# Patient Record
Sex: Male | Born: 1970 | Race: White | Hispanic: No | Marital: Married | Smoking: Never smoker
Health system: Southern US, Community
[De-identification: ages and names within clinical notes are randomized; demographics above are authoritative.]

## PROBLEM LIST (undated history)

## (undated) DIAGNOSIS — M5126 Other intervertebral disc displacement, lumbar region: Secondary | ICD-10-CM

## (undated) DIAGNOSIS — I499 Cardiac arrhythmia, unspecified: Secondary | ICD-10-CM

## (undated) DIAGNOSIS — R55 Syncope and collapse: Secondary | ICD-10-CM

## (undated) DIAGNOSIS — G4730 Sleep apnea, unspecified: Secondary | ICD-10-CM

## (undated) DIAGNOSIS — T7840XA Allergy, unspecified, initial encounter: Secondary | ICD-10-CM

## (undated) HISTORY — DX: Allergy, unspecified, initial encounter: T78.40XA

## (undated) HISTORY — DX: Sleep apnea, unspecified: G47.30

## (undated) HISTORY — DX: Other intervertebral disc displacement, lumbar region: M51.26

## (undated) HISTORY — DX: Syncope and collapse: R55

## (undated) HISTORY — DX: Cardiac arrhythmia, unspecified: I49.9

---

## 2006-12-13 ENCOUNTER — Ambulatory Visit: Payer: Self-pay | Admitting: Internal Medicine

## 2006-12-13 DIAGNOSIS — G43909 Migraine, unspecified, not intractable, without status migrainosus: Secondary | ICD-10-CM

## 2006-12-14 ENCOUNTER — Encounter (INDEPENDENT_AMBULATORY_CARE_PROVIDER_SITE_OTHER): Payer: Self-pay | Admitting: Family Medicine

## 2006-12-15 LAB — CONVERTED CEMR LAB
ALT: 18 units/L (ref 0–53)
Basophils Absolute: 0 10*3/uL (ref 0.0–0.1)
Chloride: 104 meq/L (ref 96–112)
Cholesterol: 179 mg/dL (ref 0–200)
Creatinine, Ser: 1.2 mg/dL (ref 0.4–1.5)
Eosinophils Relative: 1.2 % (ref 0.0–5.0)
Glucose, Bld: 94 mg/dL (ref 70–99)
HCT: 45.2 % (ref 39.0–52.0)
Hemoglobin: 15.4 g/dL (ref 13.0–17.0)
LDL Cholesterol: 129 mg/dL — ABNORMAL HIGH (ref 0–99)
MCHC: 34.1 g/dL (ref 30.0–36.0)
MCV: 89.6 fL (ref 78.0–100.0)
Monocytes Absolute: 0.4 10*3/uL (ref 0.2–0.7)
Neutrophils Relative %: 60.4 % (ref 43.0–77.0)
Potassium: 3.9 meq/L (ref 3.5–5.1)
RBC: 5.05 M/uL (ref 4.22–5.81)
RDW: 12.2 % (ref 11.5–14.6)
Sodium: 141 meq/L (ref 135–145)
WBC: 4.1 10*3/uL — ABNORMAL LOW (ref 4.5–10.5)

## 2007-11-01 ENCOUNTER — Ambulatory Visit: Payer: Self-pay | Admitting: Internal Medicine

## 2007-11-01 ENCOUNTER — Encounter (INDEPENDENT_AMBULATORY_CARE_PROVIDER_SITE_OTHER): Payer: Self-pay | Admitting: *Deleted

## 2008-10-30 ENCOUNTER — Telehealth: Payer: Self-pay | Admitting: Internal Medicine

## 2008-12-12 ENCOUNTER — Encounter: Payer: Self-pay | Admitting: Internal Medicine

## 2009-02-19 ENCOUNTER — Ambulatory Visit: Payer: Self-pay | Admitting: Internal Medicine

## 2010-02-19 ENCOUNTER — Ambulatory Visit
Admission: RE | Admit: 2010-02-19 | Discharge: 2010-02-19 | Payer: Self-pay | Source: Home / Self Care | Attending: Internal Medicine | Admitting: Internal Medicine

## 2010-02-19 LAB — CONVERTED CEMR LAB: Heterophile Ab Screen: NEGATIVE

## 2010-03-11 NOTE — Assessment & Plan Note (Signed)
Summary: COLD, COUGH, VERY TIRED--NO FEVER///SPH   Vital Signs:  Patient profile:   40 year old male Height:      74.75 inches Weight:      226 pounds BMI:     28.54 Temp:     98.6 degrees F oral Pulse rate:   77 / minute Pulse rhythm:   regular BP sitting:   122 / 84  (left arm) Cuff size:   large  Vitals Entered By: Army Fossa CMA (February 19, 2010 8:45 AM) CC: Pt here c/o URI?? Comments - fatigued - coughing very little - some nasal congestion Started last saturday [facility] [address]    History of Present Illness: symptoms started 6 days ago Chief complaint is fatigue. Some runny nose and slight cough Wife is also sick, she has more classic URI type of symptoms Today he feels the best in the last few days      Current Medications (verified): 1)  None  Allergies (verified): No Known Drug Allergies  Past History:  Past Medical History: Reviewed history from 12/13/2006 and no changes required. ruptured disc (Lumbar), NO SURGERY  Social History: Reviewed history from 12/13/2006 and no changes required. Married 2 kids  Review of Systems General:  subjective chills . CV:  Denies chest pain or discomfort. Resp:  mild cough, dry  slightly  tight chest w/  cough. GI:  Denies diarrhea and vomiting; some nausea . MS:  some lower back pain, mild . Derm:  Denies rash. Neuro:  HA yesterday, gone today .  Physical Exam  General:  alert, well-developed, and well-nourished.  healthy appearing Head:  face symmetric, nontender to palpation Ears:  R ear normal and L ear normal.   Nose:  no congestive Mouth:  no redness or discharge Lungs:  normal respiratory effort, no intercostal retractions, no accessory muscle use, and normal breath sounds.   Heart:  normal rate, regular rhythm, and no murmur.   Abdomen:  soft, non-tender, normal bowel sounds, no distention, no masses, and no guarding.   Extremities:  no edema    Impression &  Recommendations:  Problem # 1:  VIRAL INFECTION-UNSPEC (ICD-079.99) symptoms consistent with a viral infection.  Due to fatigue, we checked a mono ----> (-)  see instructions   Patient Instructions: 1)  rest, fluids, Tylenol as needed 2)  Robitussin-DM as needed for cough 3)  Call if not  completely well next week   Orders Added: 1)  Est. Patient Level III [16109]    Laboratory Results   Blood Tests      Mono: negative Comments: Army Fossa CMA  February 19, 2010 9:02 AM

## 2010-03-18 ENCOUNTER — Other Ambulatory Visit: Payer: Self-pay | Admitting: Internal Medicine

## 2010-03-18 ENCOUNTER — Encounter: Payer: Self-pay | Admitting: Internal Medicine

## 2010-03-18 ENCOUNTER — Encounter (INDEPENDENT_AMBULATORY_CARE_PROVIDER_SITE_OTHER): Payer: PRIVATE HEALTH INSURANCE | Admitting: Internal Medicine

## 2010-03-18 DIAGNOSIS — E785 Hyperlipidemia, unspecified: Secondary | ICD-10-CM

## 2010-03-18 DIAGNOSIS — Z0000 Encounter for general adult medical examination without abnormal findings: Secondary | ICD-10-CM

## 2010-03-18 LAB — CBC WITH DIFFERENTIAL/PLATELET
Eosinophils Relative: 1.7 % (ref 0.0–5.0)
HCT: 44.8 % (ref 39.0–52.0)
Hemoglobin: 15.8 g/dL (ref 13.0–17.0)
Lymphocytes Relative: 25.3 % (ref 12.0–46.0)
MCHC: 35.2 g/dL (ref 30.0–36.0)
MCV: 87.6 fl (ref 78.0–100.0)
Monocytes Absolute: 0.3 10*3/uL (ref 0.1–1.0)
RBC: 5.11 Mil/uL (ref 4.22–5.81)
RDW: 12.9 % (ref 11.5–14.6)

## 2010-03-18 LAB — PSA: PSA: 1.46 ng/mL (ref 0.10–4.00)

## 2010-03-18 LAB — HEPATIC FUNCTION PANEL
ALT: 24 U/L (ref 0–53)
AST: 21 U/L (ref 0–37)
Bilirubin, Direct: 0.1 mg/dL (ref 0.0–0.3)
Total Bilirubin: 0.7 mg/dL (ref 0.3–1.2)

## 2010-03-18 LAB — LIPID PANEL
Total CHOL/HDL Ratio: 5
Triglycerides: 109 mg/dL (ref 0.0–149.0)
VLDL: 21.8 mg/dL (ref 0.0–40.0)

## 2010-03-18 LAB — BASIC METABOLIC PANEL
CO2: 28 mEq/L (ref 19–32)
Chloride: 100 mEq/L (ref 96–112)
Glucose, Bld: 88 mg/dL (ref 70–99)
Potassium: 4.2 mEq/L (ref 3.5–5.1)
Sodium: 136 mEq/L (ref 135–145)

## 2010-03-25 NOTE — Assessment & Plan Note (Signed)
Summary: CPX-PT WILL BE FASTING/[facility]   Vital Signs:  Patient profile:   40 year old male Height:      75 inches Weight:      227.50 pounds Pulse rate:   71 / minute Pulse rhythm:   regular BP sitting:   130 / 84  (left arm) Cuff size:   large  Vitals Entered By: Army Fossa CMA (March 18, 2010 9:41 AM) CC: CPX, fasting  Comments no complaints  [facility]    History of Present Illness: CPX  Current Medications (verified): 1)  None  Allergies (verified): No Known Drug Allergies  Past History:  Past Medical History: ruptured disc (Lumbar), NO SURGERY MIGRAINES   Past Surgical History: no surgeries   Family History: DM - M HTN - M CHF - F MI--Father 68 y/o ? Colon ca--no prostate ca-- B dx 49      Social History: Married 2 kids [place]aco-- never ETOH-- socially  exercise-- no routine but active   Review of Systems General:  Denies fatigue, fever, and weight loss. CV:  Denies chest pain or discomfort and swelling of feet. Resp:  had a respiratory illness few weeks ago, since then has noted that his "respiratory capacity  is not the same" when he sings in church. Denies cough, wheezing, sputum production, dyspnea on exertion.Marland Kitchen GI:  Denies bloody stools, nausea, and vomiting. GU:  Denies dysuria, hematuria, urinary frequency, and urinary hesitancy. Neuro:  history of migraines, compared  to previous years his migraines have been less frequent than before. They usually respond to Excedrin over-the-counter, very rarely have a severe migraine with nausea. Usual trigger is stress.  Physical Exam  General:  alert, well-developed, and well-nourished.   Neck:  no masses and no thyromegaly.   Lungs:  normal respiratory effort, no intercostal retractions, no accessory muscle use, and normal breath sounds.   Heart:  normal rate, regular rhythm, and no murmur.   Abdomen:  soft, non-tender, normal bowel sounds, no distention, no masses, no guarding,  and no rigidity.   Rectal:  No external abnormalities noted. Normal sphincter tone. No rectal masses or tenderness. Prostate:  Prostate gland firm and smooth, no enlargement, nodularity, tenderness, mass, asymmetry or induration. Extremities:  no edema Psych:  Cognition and judgment appear intact. Alert and cooperative with normal attention span and concentration.  not anxious appearing and not depressed appearing.     Impression & Recommendations:  Problem # 1:  HEALTH SCREENING (ICD-V70.0) Td 08 had a flu shot   never had a Cscope  His brother was diagnosed with prostate cancer recently, DRE  normal, check a PSA Labs diet and  exercise discussed ekg normal, @ baseline  see ROS "respiratory capacity not the same". Physical exam normal, patient will call me if this persists more than 3 weeks. (CXR PFTs ?)  Orders: Venipuncture (16109) TLB-BMP (Basic Metabolic Panel-BMET) (80048-METABOL) TLB-CBC Platelet - w/Differential (85025-CBCD) TLB-Hepatic/Liver Function Pnl (80076-HEPATIC) TLB-Lipid Panel (80061-LIPID) TLB-PSA (Prostate Specific Antigen) (84153-PSA) TLB-TSH (Thyroid Stimulating Hormone) (84443-TSH) Specimen Handling (60454) EKG w/ Interpretation (93000)  Problem # 2:  MIGRAINE HEADACHE (ICD-346.90) well  controlled, see review of systems  Rarely he has severe migraines. Patient knows to call if he has unusual headaches or the headaches are more frequent  Patient Instructions: 1)  Please schedule a follow-up appointment in 1 year.    Orders Added: 1)  Venipuncture [36415] 2)  TLB-BMP (Basic Metabolic Panel-BMET) [80048-METABOL] 3)  TLB-CBC Platelet - w/Differential [85025-CBCD] 4)  TLB-Hepatic/Liver  Function Pnl [80076-HEPATIC] 5)  TLB-Lipid Panel [80061-LIPID] 6)  TLB-PSA (Prostate Specific Antigen) [84153-PSA] 7)  TLB-TSH (Thyroid Stimulating Hormone) [84443-TSH] 8)  Specimen Handling [99000] 9)  EKG w/ Interpretation [93000] 10)  Est. Patient age 22-64  [phone]

## 2010-03-31 NOTE — Letter (Signed)
Summary: [employer] Personalized Risk Profile  [employer] Personalized Risk Profile   Imported By: Maryln Gottron 03/23/2010 10:21:21  _____________________________________________________________________  External Attachment:    Type:   Image     Comment:   External Document

## 2010-04-08 ENCOUNTER — Encounter: Payer: Self-pay | Admitting: Internal Medicine

## 2010-04-08 ENCOUNTER — Ambulatory Visit (INDEPENDENT_AMBULATORY_CARE_PROVIDER_SITE_OTHER): Payer: PRIVATE HEALTH INSURANCE | Admitting: Internal Medicine

## 2010-04-08 DIAGNOSIS — J029 Acute pharyngitis, unspecified: Secondary | ICD-10-CM

## 2010-04-15 NOTE — Assessment & Plan Note (Signed)
Summary: congested/cbs   Vital Signs:  Patient profile:   40 year old male Weight:      225 pounds Temp:     98.2 degrees F oral Pulse rate:   79 / minute Pulse rhythm:   regular BP sitting:   120 / 82  (left arm) Cuff size:   large  Vitals Entered By: Army Fossa CMA (April 08, 2010 10:28 AM) CC: Pt here c/o head congestion, sore throat and HA Comments x 3 days  [facility]    History of Present Illness: x 3 days: ST , moderate  can't sleep due to pain Mild cough   no postnasal dripping   Current Medications (verified): 1)  None  Allergies (verified): No Known Drug Allergies  Past History:  Past Medical History: Reviewed history from 03/18/2010 and no changes required. ruptured disc (Lumbar), NO SURGERY MIGRAINES   Past Surgical History: Reviewed history from 03/18/2010 and no changes required. no surgeries   Review of Systems General:  Denies fever. ENT:  + fontal HA, no d/c or PN drip. Resp:  Denies sputum productive and wheezing.  Physical Exam  General:  alert, well-developed, and well-nourished.   Ears:  R ear normal and L ear normal.   Mouth:   no redness or discharge, tonsils not seen Lungs:  normal respiratory effort, no intercostal retractions, no accessory muscle use, and normal breath sounds.   Heart:  normal rate, regular rhythm, and no murmur.     Impression & Recommendations:  Problem # 1:  PHARYNGITIS-ACUTE (ICD-462)  likely  viral pharyngitis, see instructions  reports intolerance to hydrocodone, apparently has never tried codeine. Will prescribe  codeine, will call if he has any problems.  Orders: Rapid Strep (04540)  Complete Medication List: 1)  Guiatuss Ac 100-10 Mg/25ml Syrp (Guaifenesin-codeine) .Marland Kitchen.. 1 or 2 the patient  by mouth at bedtime  as needed pain , cough  Patient Instructions: 1)  rest, fluids, tylenol 2)  mucinex DM as needed (day time) 3)  codeine  (bedtime) 4)  gargles 5)  call if no better in  few days  Prescriptions: GUIATUSS AC 100-10 MG/5ML SYRP (GUAIFENESIN-CODEINE) 1 or 2 the patient  by mouth at bedtime  as needed pain , cough  #150cc x 0   Entered and Authorized by:   Elita Quick E. Kayson Tasker MD   Signed by:   Nolon Rod. Caliegh Middlekauff MD on 04/08/2010   Method used:   Print then Give to Patient   RxID:   [id number]    Orders Added: 1)  Rapid Strep [87880] 2)  Est. Patient Level III [99213]    Laboratory Results    Other Tests  Rapid Strep: negative Comments: Army Fossa CMA  April 08, 2010 10:41 AM

## 2010-10-28 ENCOUNTER — Telehealth: Payer: Self-pay | Admitting: Internal Medicine

## 2010-10-28 NOTE — Telephone Encounter (Signed)
Advise patient: Due for a FLP, DX hyperlipidemia  (see Centricity) Please arrange

## 2010-11-11 NOTE — Telephone Encounter (Signed)
Lab appt scheduled [id number] @ 9:00

## 2010-11-15 ENCOUNTER — Other Ambulatory Visit: Payer: Self-pay | Admitting: Internal Medicine

## 2010-11-16 ENCOUNTER — Other Ambulatory Visit (INDEPENDENT_AMBULATORY_CARE_PROVIDER_SITE_OTHER): Payer: PRIVATE HEALTH INSURANCE

## 2010-11-16 DIAGNOSIS — E785 Hyperlipidemia, unspecified: Secondary | ICD-10-CM

## 2010-11-16 LAB — LIPID PANEL
LDL Cholesterol: 135 mg/dL — ABNORMAL HIGH (ref 0–99)
Total CHOL/HDL Ratio: 5
Triglycerides: 93 mg/dL (ref 0.0–149.0)

## 2010-11-16 NOTE — Progress Notes (Signed)
Labs only

## 2011-05-30 ENCOUNTER — Ambulatory Visit (INDEPENDENT_AMBULATORY_CARE_PROVIDER_SITE_OTHER): Payer: PRIVATE HEALTH INSURANCE | Admitting: Internal Medicine

## 2011-05-30 ENCOUNTER — Encounter: Payer: Self-pay | Admitting: Internal Medicine

## 2011-05-30 VITALS — BP 116/70 | HR 72 | Temp 98.6°F | Ht 75.0 in | Wt 228.0 lb

## 2011-05-30 DIAGNOSIS — J329 Chronic sinusitis, unspecified: Secondary | ICD-10-CM

## 2011-05-30 MED ORDER — FLUTICASONE PROPIONATE 50 MCG/ACT NA SUSP: 2.0000 | Freq: Every day | NASAL | Status: DC

## 2011-05-30 MED ORDER — AMOXICILLIN 500 MG PO CAPS: 1000.0000 mg | ORAL_CAPSULE | Freq: Two times a day (BID) | ORAL | Status: AC

## 2011-05-30 NOTE — Patient Instructions (Signed)
Rest, fluids , tylenol For cough, take Mucinex DM twice a day as needed  For congestion use flonase, a nasal spray daily  until you feel better Take the antibiotic as prescribed  (Amoxicillin) Call if no better in few days Call anytime if the symptoms are severe -------- You are due for a physical, schedule a visit at your convenience

## 2011-05-30 NOTE — Progress Notes (Signed)
  Subjective:    Patient ID: Joe Castro, male    DOB: 01-Nov-1970, 41 y.o.   MRN: [id number]  HPI Acute visit 2 weeks ago, developed a  "sinus headache", he took some antihistaminics and felt slightly better but has a lingering sinus pressure and congestion on and off. Taking some Mucinex as needed.  Past Medical History: ruptured disc (Lumbar), NO SURGERY MIGRAINES   Past Surgical History: no surgeries   Family History: DM - M HTN - M CHF - F MI--Father 44 y/o ? Colon ca--no prostate ca-- B dx 53      Social History: Married, 2 kids [place]-- never ETOH-- socially  exercise-- no routine but active   Review of Systems No fever or chills No itchy eyes or itchy nose. Very mild cough mostly dry.     Objective:   Physical Exam General -- alert, well-developed. No apparent distress.  Neck --no LADs HEENT -- TMs normal, throat w/o redness, face symmetric and not tender to palpation, nose not congested . Lungs -- normal respiratory effort, no intercostal retractions, no accessory muscle use, and normal breath sounds.   Heart-- normal rate, regular rhythm, no murmur, and no gallop.   Extremities-- no pretibial edema bilaterally  psych-- Cognition and judgment appear intact. Alert and cooperative with normal attention span and concentration.  not anxious appearing and not depressed appearing.       Assessment & Plan:   Upper respiratory symptoms for 2 weeks on and off, review of systems is negative for sneezing, itchy eyes,  itchy nose. Mild sinusitis?. See  instructions

## 2011-05-31 ENCOUNTER — Encounter: Payer: Self-pay | Admitting: Internal Medicine

## 2012-07-03 ENCOUNTER — Ambulatory Visit (INDEPENDENT_AMBULATORY_CARE_PROVIDER_SITE_OTHER): Payer: PRIVATE HEALTH INSURANCE | Admitting: Internal Medicine

## 2012-07-03 VITALS — BP 116/82 | HR 69 | Temp 98.5°F | Wt 230.0 lb

## 2012-07-03 DIAGNOSIS — L259 Unspecified contact dermatitis, unspecified cause: Secondary | ICD-10-CM

## 2012-07-03 DIAGNOSIS — H9319 Tinnitus, unspecified ear: Secondary | ICD-10-CM

## 2012-07-03 DIAGNOSIS — H9313 Tinnitus, bilateral: Secondary | ICD-10-CM

## 2012-07-03 MED ORDER — PREDNISONE 10 MG PO TABS: ORAL_TABLET | ORAL | Status: DC

## 2012-07-03 MED ORDER — HYDROCORTISONE 2.5 % EX CREA: TOPICAL_CREAM | Freq: Two times a day (BID) | CUTANEOUS | Status: DC

## 2012-07-03 NOTE — Progress Notes (Signed)
  Subjective:    Patient ID: Joe Castro, male    DOB: 1970/06/22, 42 y.o.   MRN: [id number]  HPI aacute visit Worked at the yard 4 days ago, the next day developed very itchy rash, he thinks consistent with poison ivy which he had before. Also, one-month history of very mild, bilateral, symmetric tinnitus mostly at night  (" when is quiet").  Past Medical History: ruptured disc (Lumbar), NO SURGERY MIGRAINES   Past Surgical History: no surgeries    Family History: DM - M HTN - M CHF - F MI--Father 33 y/o ? Colon ca--no prostate ca-- B dx 41   Social History: Married, 2 kids [place]-- never ETOH-- socially     Review of Systems Denies fever, chills, joint aches. No nausea vomiting or diarrhea. Denies dizziness, headaches or decreased hearing    Objective:   Physical Exam  Skin:       General -- alert, well-developed, NAD HEENT -- TMs normal  Extremities-- no pretibial edema bilaterally  Neurologic-- alert & oriented X3 and strength normal in all extremities. Psych-- Cognition and judgment appear intact. Alert and cooperative with normal attention span and concentration.  not anxious appearing and not depressed appearing.       Assessment & Plan:  Contact dermatitis, likely poison ivy. Counseled about prevention and recommend wash all his clothing. See instructions  tinnitus Very mild, no decreased hearing. Recommend observation, consider ENT referral at some point.

## 2012-07-03 NOTE — Patient Instructions (Addendum)
Take prednisone as prescribed OTC Zyrtec 10 mg one daily until better. Ranitidine  75 mg one tablet twice a day until better Use the cream as needed Call if not better or if symptoms resurface

## 2012-07-04 ENCOUNTER — Encounter: Payer: Self-pay | Admitting: Internal Medicine

## 2012-07-15 ENCOUNTER — Other Ambulatory Visit: Payer: Self-pay | Admitting: Internal Medicine

## 2012-08-21 ENCOUNTER — Ambulatory Visit (INDEPENDENT_AMBULATORY_CARE_PROVIDER_SITE_OTHER): Payer: BC Managed Care – PPO | Admitting: Internal Medicine

## 2012-08-21 ENCOUNTER — Encounter: Payer: Self-pay | Admitting: Internal Medicine

## 2012-08-21 VITALS — BP 120/75 | HR 72 | Temp 98.5°F | Ht 74.5 in | Wt 226.8 lb

## 2012-08-21 DIAGNOSIS — H9313 Tinnitus, bilateral: Secondary | ICD-10-CM

## 2012-08-21 DIAGNOSIS — Z0000 Encounter for general adult medical examination without abnormal findings: Secondary | ICD-10-CM

## 2012-08-21 DIAGNOSIS — H9319 Tinnitus, unspecified ear: Secondary | ICD-10-CM | POA: Insufficient documentation

## 2012-08-21 LAB — COMPREHENSIVE METABOLIC PANEL
AST: 23 U/L (ref 0–37)
Albumin: 4.3 g/dL (ref 3.5–5.2)
BUN: 15 mg/dL (ref 6–23)
CO2: 31 mEq/L (ref 19–32)
Calcium: 9.8 mg/dL (ref 8.4–10.5)
Chloride: 105 mEq/L (ref 96–112)
Creatinine, Ser: 1.2 mg/dL (ref 0.4–1.5)
GFR: 67.82 mL/min (ref >60.00)
Potassium: 4.3 mEq/L (ref 3.5–5.1)

## 2012-08-21 LAB — CBC WITH DIFFERENTIAL/PLATELET
Basophils Absolute: 0 10*3/uL (ref 0.0–0.1)
Basophils Relative: 1 % (ref 0.0–3.0)
Eosinophils Absolute: 0.1 10*3/uL (ref 0.0–0.7)
Lymphocytes Relative: 29.8 % (ref 12.0–46.0)
MCHC: 34 g/dL (ref 30.0–36.0)
Monocytes Relative: 8.3 % (ref 3.0–12.0)
Neutrophils Relative %: 59.2 % (ref 43.0–77.0)
RBC: 4.86 Mil/uL (ref 4.22–5.81)
RDW: 13.1 % (ref 11.5–14.6)

## 2012-08-21 LAB — LIPID PANEL
HDL: 38.5 mg/dL — ABNORMAL LOW (ref >39.00)
Triglycerides: 109 mg/dL (ref 0.0–149.0)
VLDL: 21.8 mg/dL (ref 0.0–40.0)

## 2012-08-21 NOTE — Assessment & Plan Note (Addendum)
Td ~ 2009 per pt Never had a cscope Diet-exer cise: doing well , encouraged to cont w/ a healthy life style + FH prostae ca, DRE normal today, check a PSA  Episodes of feeling weak as described, given the circumstances, I think he had  an issue of  lack of fluids and exhaustion. EKG today no acute changes, at baseline Recommend to call if he has further episodes, good hydration as well.

## 2012-08-21 NOTE — Progress Notes (Signed)
  Subjective:    Patient ID: Joe Castro, male    DOB: 1970/12/01, 42 y.o.   MRN: [id number]  HPI CPX  Past Medical History  Diagnosis Date  . Ruptured lumbar disc     No surgery  . Migraine    Past Surgical History  Procedure Laterality Date  . No past surgeries     History   Social History  . Marital Status: Married    Spouse Name: N/A    Number of Children: 2  . Years of Education: N/A   Occupational History  . [employer] [place]    Social History Main Topics  . Smoking status: Never Smoker   . Smokeless tobacco: Never Used  . Alcohol Use: Yes     Comment: Socilly  . Drug Use: No  . Sexually Active: Not on file   Other Topics Concern  . Not on file   Social History Narrative   2 teenagers daughters   Family History  Problem Relation Age of Onset  . Diabetes Mother   . Hypertension Mother   . Heart disease Father     CHF  . Heart attack Father 71  . Prostate cancer Brother 61  . Colon cancer Neg Hx       Review of Systems DIET: Trying to eat healthy EXERCISE:Active, takes walks twice a week, does yard work Frequently and sometimes he Had 2 episodes of feeling weak and  looked pale. This happened after he worked in the yard for ~ 4 hours, did not drink fluids except for coffee and a protein shake. At that time did not have any chest pain or shortness of breath, symptoms improved with rest, eating and drinking. No  CP, SOB w/ activities of daily living or walks, no lower extremity edema No nausea, vomiting diarrhea No cough, sputum production No dysuria, gross hematuria, difficulty urinating   No anxiety, depression + tinnitus x years        Objective:   Physical Exam BP 120/75  Pulse 72  Temp(Src) 98.5 F (36.9 C) (Oral)  Ht 6' 2.5" (1.892 m)  Wt 226 lb 12.8 oz (102.876 kg)  BMI 28.74 kg/m2  SpO2 98%  General -- alert, well-developed, NAD Neck --no thyromegaly , normal carotid pulse  HEENT -- TMs normal, throat w/o  redness, face symmetric and not tender to palpation Lungs -- normal respiratory effort, no intercostal retractions, no accessory muscle use, and normal breath sounds.   Heart-- normal rate, regular rhythm, no murmur, and no gallop.   Abdomen--soft, non-tender, no distention, no masses, no HSM, no guarding, and no rigidity.   Extremities-- no pretibial edema bilaterally Rectal-- No external abnormalities noted. Normal sphincter tone. No rectal masses or tenderness. Brown stool  Prostate:  Prostate gland firm and smooth, no enlargement, nodularity, tenderness, mass, asymmetry or induration. Neurologic-- alert & oriented X3 and strength normal in all extremities. Psych-- Cognition and judgment appear intact. Alert and cooperative with normal attention span and concentration.  not anxious appearing and not depressed appearing.       Assessment & Plan:

## 2012-08-21 NOTE — Assessment & Plan Note (Signed)
Persistent tinnitus, no hearing loss, was exposed to a noisy environment short-term many years ago. Plan: Refer to an audiologist

## 2012-08-23 ENCOUNTER — Encounter: Payer: Self-pay | Admitting: *Deleted

## 2013-04-22 ENCOUNTER — Other Ambulatory Visit: Payer: Self-pay | Admitting: Internal Medicine

## 2013-05-08 HISTORY — PX: VASECTOMY: SHX75

## 2013-08-20 ENCOUNTER — Ambulatory Visit (INDEPENDENT_AMBULATORY_CARE_PROVIDER_SITE_OTHER): Payer: BC Managed Care – PPO | Admitting: Internal Medicine

## 2013-08-20 ENCOUNTER — Encounter: Payer: Self-pay | Admitting: Internal Medicine

## 2013-08-20 VITALS — BP 131/79 | HR 62 | Temp 98.2°F | Wt 225.0 lb

## 2013-08-20 DIAGNOSIS — R55 Syncope and collapse: Secondary | ICD-10-CM | POA: Insufficient documentation

## 2013-08-20 LAB — COMPREHENSIVE METABOLIC PANEL
ALK PHOS: 45 U/L (ref 39–117)
ALT: 18 U/L (ref 0–53)
AST: 23 U/L (ref 0–37)
Albumin: 4.3 g/dL (ref 3.5–5.2)
BILIRUBIN TOTAL: 0.6 mg/dL (ref 0.2–1.2)
BUN: 14 mg/dL (ref 6–23)
CO2: 29 meq/L (ref 19–32)
CREATININE: 1.1 mg/dL (ref 0.4–1.5)
Calcium: 9.5 mg/dL (ref 8.4–10.5)
Chloride: 104 mEq/L (ref 96–112)
GFR: 76.81 mL/min (ref >60.00)
Glucose, Bld: 81 mg/dL (ref 70–99)
Potassium: 3.9 mEq/L (ref 3.5–5.1)
SODIUM: 138 meq/L (ref 135–145)
TOTAL PROTEIN: 7.2 g/dL (ref 6.0–8.3)

## 2013-08-20 LAB — CBC WITH DIFFERENTIAL/PLATELET
BASOS ABS: 0 10*3/uL (ref 0.0–0.1)
Basophils Relative: 0.9 % (ref 0.0–3.0)
Eosinophils Absolute: 0 10*3/uL (ref 0.0–0.7)
Eosinophils Relative: 1.1 % (ref 0.0–5.0)
HEMATOCRIT: 42.8 % (ref 39.0–52.0)
HEMOGLOBIN: 14.3 g/dL (ref 13.0–17.0)
LYMPHS ABS: 1.1 10*3/uL (ref 0.7–4.0)
Lymphocytes Relative: 26.5 % (ref 12.0–46.0)
MCHC: 33.5 g/dL (ref 30.0–36.0)
MCV: 88.6 fl (ref 78.0–100.0)
MONO ABS: 0.3 10*3/uL (ref 0.1–1.0)
Monocytes Relative: 8.4 % (ref 3.0–12.0)
NEUTROS ABS: 2.6 10*3/uL (ref 1.4–7.7)
Neutrophils Relative %: 63.1 % (ref 43.0–77.0)
PLATELETS: 269 10*3/uL (ref 150.0–400.0)
RBC: 4.83 Mil/uL (ref 4.22–5.81)
RDW: 12.8 % (ref 11.5–15.5)
WBC: 4.1 10*3/uL (ref 4.0–10.5)

## 2013-08-20 LAB — TSH: TSH: 1.54 u[IU]/mL (ref 0.35–4.50)

## 2013-08-20 NOTE — Assessment & Plan Note (Addendum)
Episodes of syncope as described above, in the past he also had times when he felt weak and looked pale.  Was admitted to [facility] hospital few months ago with another syncope, was in ICU , w/u neg per pt. EKG today without acute changes but does have some ectopy. Most likely, symptoms are vasovagal but will refer to cardiology, may need further eval. In the meantime will order a Holter. Ger records from the hospital Will also check labs. To call if symptoms resurface,  for now recommend rest and drink plenty of fluids

## 2013-08-20 NOTE — Progress Notes (Signed)
Pre visit review using our clinic review tool, if applicable. No additional management support is needed unless otherwise documented below in the visit note. 

## 2013-08-20 NOTE — Patient Instructions (Signed)
Get your blood work before you leave   Next visit is for a physical exam in3 months  , fasting Please make an appointment

## 2013-08-20 NOTE — Progress Notes (Signed)
   Subjective:    Patient ID: Joe Castro, male    DOB: [birth year], 43 y.o.   MRN: [id number]  DOS:  08/20/2013 Type of visit - description: acute History: On July 3th He was sleeping at home, his father called him because mom fell down, he quickly went downstairs, bend over and tried to pick up her mother, immediately got lightheaded, so he had to stop, laid down put his feet up and felt  better. He stood up again and the symptoms came back, shortly after he felt he was fainting but was able to get on a chair , sat down and lost consciousness for around 30 seconds. Wife note that his left arm shaking a little bit but no generalize seizure, lost of b/b control or postictal state  The ambulance arrived , VSS per pt, EKGs show a heart rate of 64, the patient show me the EKG and it is at baseline. No further episodes.   ROS No chest pain or difficulty breathing. No palpitations no dyspnea on exertion No headaches, slurred speech, double vision or motor deficits  Past Medical History  Diagnosis Date  . Ruptured lumbar disc     No surgery  . Migraine     Past Surgical History  Procedure Laterality Date  . No past surgeries      History   Social History  . Marital Status: Married    Spouse Name: N/A    Number of Children: 2  . Years of Education: N/A   Occupational History  . 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 Smoking status: Never Smoker   . Smokeless tobacco: Never Used  . Alcohol Use: Yes     Comment: Socilly  . Drug Use: No  . Sexual Activity: Not on file   Other Topics Concern  . Not on file   Social History Narrative   2 teenagers daughters        Medication List       This list is accurate as of: 08/20/13  6:58 PM.  Always use your most recent med list.               fluticasone 50 MCG/ACT nasal spray  Commonly known as:  FLONASE  INSTILL 2 SPRAYS INTO THE NOSE DAILY     multivitamin tablet  Take 1 tablet by mouth  daily.     NON FORMULARY  1 tablet daily. Calcium, magnesium, zinc           Objective:   Physical Exam BP 131/79  Pulse 62  Temp(Src) 98.2 F (36.8 C)  Wt 225 lb (102.059 kg)  SpO2 98%  General -- alert, well-developed, NAD.  HEENT-- Not pale.  Lungs -- normal respiratory effort, no intercostal retractions, no accessory muscle use, and normal breath sounds.  Heart-- normal rate, regular rhythm, no murmur.  Abdomen-- Not distended, good bowel sounds,soft, non-tender.  Extremities-- no pretibial edema bilaterally  Neurologic--  alert & oriented X3. Speech normal, gait appropriate for age, strength symmetric and appropriate for age.   Psych-- Cognition and judgment appear intact. Cooperative with normal attention span and concentration. No anxious or depressed appearing.     Assessment & Plan:

## 2013-08-21 ENCOUNTER — Encounter: Payer: Self-pay | Admitting: *Deleted

## 2013-08-29 ENCOUNTER — Encounter (INDEPENDENT_AMBULATORY_CARE_PROVIDER_SITE_OTHER): Payer: BC Managed Care – PPO

## 2013-08-29 ENCOUNTER — Encounter: Payer: Self-pay | Admitting: *Deleted

## 2013-08-29 DIAGNOSIS — R55 Syncope and collapse: Secondary | ICD-10-CM

## 2013-08-29 NOTE — Progress Notes (Signed)
Patient ID: Joe Castro., male   DOB: 06/06/1970, 43 y.o.   MRN: [id number] E-Cardio 48 hour holter monitor applied to patient.

## 2013-10-04 ENCOUNTER — Encounter: Payer: Self-pay | Admitting: Internal Medicine

## 2013-10-04 ENCOUNTER — Ambulatory Visit (INDEPENDENT_AMBULATORY_CARE_PROVIDER_SITE_OTHER): Payer: BC Managed Care – PPO | Admitting: Internal Medicine

## 2013-10-04 VITALS — BP 122/82 | HR 58 | Ht 76.0 in | Wt 220.0 lb

## 2013-10-04 DIAGNOSIS — R55 Syncope and collapse: Secondary | ICD-10-CM

## 2013-10-04 DIAGNOSIS — I4949 Other premature depolarization: Secondary | ICD-10-CM

## 2013-10-04 DIAGNOSIS — I493 Ventricular premature depolarization: Secondary | ICD-10-CM

## 2013-10-04 NOTE — Patient Instructions (Signed)
Your physician recommends that you continue on your current medications as directed. Please refer to the Current Medication list given to you today. Dr. Harrington Challenger will be requesting your medical records from [facility].  Once she reviews those, we will be following up with you.

## 2013-10-04 NOTE — Progress Notes (Signed)
HPI  Patient is a 43 yo whi is referred for evaluation of syncope The patinet has had a couple periods with syncope.  One occurred last December.  He had N/V at time with poor po intake.  Passed out x 2 at home Went to [facility] ER  Given IV fluids and MSO4  Passed out again.  Admitted  Says she underwent extensive testing.    This summer he had another syncopal spell.  Sleeping  Dad woke him up because mom fell  He ran downstairs.  Tried to lift her  A lot of straining. When stood up got dizzy  Sat down.  Still not feeling good.  Went to room upstairs.  Passed out.  Since then he denies furhter spells  He says he does get dizzy if he bends then stands quickly Not dizzy at other times Active  No SOB  No palpitations.   Wore a holter monitor here for 48 hours  Had extensive PVCs  (17-18% total beats).  No NSVT He does not feel  Allergies  Allergen Reactions  . Hydrocodone Nausea And Vomiting    Current Outpatient Prescriptions  Medication Sig Dispense Refill  . fluticasone (FLONASE) 50 MCG/ACT nasal spray INSTILL 2 SPRAYS INTO THE NOSE DAILY prn      . Multiple Vitamin (MULTIVITAMIN) tablet Take 1 tablet by mouth daily.      . NON FORMULARY 1 tablet daily. Calcium, magnesium, zinc       No current facility-administered medications for this visit.    Past Medical History  Diagnosis Date  . Ruptured lumbar disc     No surgery  . Migraine     Past Surgical History  Procedure Laterality Date  . No past surgeries      Family History  Problem Relation Age of Onset  . Diabetes Mother   . Hypertension Mother   . Heart disease Father     CHF  . Heart attack Father 43  . Prostate cancer Brother 67  . Colon cancer Neg Hx     History   Social History  . Marital Status: Married    Spouse Name: N/A    Number of Children: 2  . Years of Education: N/A   Occupational History  . 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 Smoking status: Never Smoker    . Smokeless tobacco: Never Used  . Alcohol Use: Yes     Comment: Socilly  . Drug Use: No  . Sexual Activity: Not on file   Other Topics Concern  . Not on file   Social History Narrative   2 teenagers daughters    Review of Systems:  All systems reviewed.  They are negative to the above problem except as previously stated.  Vital Signs: BP 122/82  Pulse 58  Ht 6\' 4"  (1.93 m)  Wt 220 lb (99.791 kg)  BMI 26.79 kg/m2  Physical Exam Patinet is in NAD HEENT:  Normocephalic, atraumatic. EOMI, PERRLA.  Neck: JVP is normal.  No bruits.  Lungs: clear to auscultation. No rales no wheezes.  Heart: Regular rate and rhythm. Normal S1, S2. No S3.   No significant murmurs. PMI not displaced.  Abdomen:  Supple, nontender. Normal bowel sounds. No masses. No hepatomegaly.  Extremities:   Good distal pulses throughout. No lower extremity edema.  Musculoskeletal :moving all extremities.  Neuro:   alert and oriented x3.  CN II-XII grossly intact.   Assessment and Plan:  1.  Syncope  He has had 2 periods with syncope  One with N/V  Clinically sounds like dehydrated Second spell after rapid changes in position.   He is not orhtostatic on exam.   WIll review outside tests from [facility] Encouraged him to avoid rapid changes in position.  Stay hydrated     2.  PVCs  Extensive  Appears to be asymptomatic  Will need to be followed  Will check work up at [facility] Will need f/u holter.  No meds for now.

## 2013-10-29 ENCOUNTER — Telehealth: Payer: Self-pay

## 2013-10-29 NOTE — Telephone Encounter (Signed)
Please advise 

## 2013-10-29 NOTE — Telephone Encounter (Signed)
Joe Castro, Brooke Bonito Self [phone]  [facility] called concerned about some bruising on his thigh that his wife found over the weekend, fairly large area, plus he has had some numbness in same leg below the knee for last couple of days. I made him an appointment for Thursday at 11:15, does he need to come sooner. He has a dentist appointment today 3 pm till 4 pm

## 2013-10-29 NOTE — Telephone Encounter (Signed)
If symptoms severe or getting worse needs to call and will get him sooner otherwise will see him in 2 days

## 2013-10-31 ENCOUNTER — Encounter: Payer: Self-pay | Admitting: Internal Medicine

## 2013-10-31 ENCOUNTER — Ambulatory Visit (INDEPENDENT_AMBULATORY_CARE_PROVIDER_SITE_OTHER): Payer: BC Managed Care – PPO | Admitting: Internal Medicine

## 2013-10-31 VITALS — BP 132/68 | HR 64 | Temp 97.9°F | Wt 217.5 lb

## 2013-10-31 DIAGNOSIS — R209 Unspecified disturbances of skin sensation: Secondary | ICD-10-CM

## 2013-10-31 DIAGNOSIS — R200 Anesthesia of skin: Secondary | ICD-10-CM

## 2013-10-31 NOTE — Progress Notes (Signed)
Pre visit review using our clinic review tool, if applicable. No additional management support is needed unless otherwise documented below in the visit note. 

## 2013-10-31 NOTE — Progress Notes (Signed)
   Subjective:    Patient ID: Joe Castro., male    DOB: [birth year], 43 y.o.   MRN: [id number]  DOS:  10/31/2013 Type of visit - description : acute Interval history: 2 months history of on and off numbness in the left leg, from the knee down, usually worse after he walks in the treadmill. Denies   swelling or rash in the lower extremity. occ L hip discomfort w/ walking  10 days ago he played football for 3 hours, no obvious injury, he did have soreness @ the legs after the exercise. 5 days later, he noticed some bruising at the left leg.    ROS Denies nausea, vomiting, diarrhea, blood in the stools or abdominal pain. No gross hematuria No motor deficits, no bladder or bowel incontinence  Past Medical History  Diagnosis Date  . Ruptured lumbar disc     No surgery  . Migraine     Past Surgical History  Procedure Laterality Date  . No past surgeries      History   Social History  . Marital Status: Married    Spouse Name: N/A    Number of Children: 2  . Years of Education: N/A   Occupational History  . [employer] [place] History Main Topics  . Smoking status: Never Smoker   . Smokeless tobacco: Never Used  . Alcohol Use: Yes     Comment: Socilly  . Drug Use: No  . Sexual Activity: Not on file   Other Topics Concern  . Not on file   Social History Narrative   2 teenagers daughters        Medication List       This list is accurate as of: 10/31/13  2:19 PM.  Always use your most recent med list.               fluticasone 50 MCG/ACT nasal spray  Commonly known as:  FLONASE  INSTILL 2 SPRAYS INTO THE NOSE DAILY prn     multivitamin tablet  Take 1 tablet by mouth daily.     NON FORMULARY  1 tablet daily. Calcium, magnesium, zinc           Objective:   Physical Exam  Skin:      BP 132/68  Pulse 64  Temp(Src) 97.9 F (36.6 C) (Oral)  Wt 217 lb 8 oz (98.657 kg)  SpO2 97% General -- alert, well-developed, NAD.    Back-- no TTP Extremities-- no pretibial edema bilaterally ; Quadriceps intact to palpation, he does have ecchymoses, see graphic. Knees-ankles  normal to inspection on palpation Neurologic--  alert & oriented X3. Speech normal, gait appropriate for age.  DTRs symmetric (slt decreased R ankle kerk); Motor exam symmetric including ankles, toes. Pinprick examination : slt decreased L dorsum? Psych-- Cognition and judgment appear intact. Cooperative with normal attention span and concentration. No anxious or depressed appearing.        Assessment & Plan:   Ecchymoses, Ecchymoses after he play football, muscular exam normal, recommend observation.  Numbness, left leg. Related to a radiculopathy or nerve entrapment (symptoms increase w/ walking) No red flag symptoms, we agreed on observation for the next few weeks, if no better or if  get worse he will call for a neurology referral.

## 2013-11-27 ENCOUNTER — Ambulatory Visit (INDEPENDENT_AMBULATORY_CARE_PROVIDER_SITE_OTHER): Payer: BC Managed Care – PPO | Admitting: Internal Medicine

## 2013-11-27 ENCOUNTER — Encounter: Payer: Self-pay | Admitting: Internal Medicine

## 2013-11-27 VITALS — BP 142/78 | HR 81 | Temp 97.5°F | Wt 220.4 lb

## 2013-11-27 DIAGNOSIS — R55 Syncope and collapse: Secondary | ICD-10-CM

## 2013-11-27 DIAGNOSIS — Z0000 Encounter for general adult medical examination without abnormal findings: Secondary | ICD-10-CM

## 2013-11-27 LAB — LIPID PANEL
CHOLESTEROL: 184 mg/dL (ref 0–200)
HDL: 45.7 mg/dL (ref >39.00)
LDL CALC: 125 mg/dL — AB (ref 0–99)
NonHDL: 138.3
TRIGLYCERIDES: 67 mg/dL (ref 0.0–149.0)
Total CHOL/HDL Ratio: 4
VLDL: 13.4 mg/dL (ref 0.0–40.0)

## 2013-11-27 NOTE — Patient Instructions (Signed)
Get your blood work before you leave   Check the  blood pressure monthly  Be sure your blood pressure is between  145/85  and 110/65.  if it is consistently higher or lower, let me know    Front desk:  Please get a ROI and fax it to [facility] ---> request all records    Please come back to the office in 1 year  for a physical exam. Come back fasting

## 2013-11-27 NOTE — Progress Notes (Signed)
Pre visit review using our clinic review tool, if applicable. No additional management support is needed unless otherwise documented below in the visit note. 

## 2013-11-27 NOTE — Progress Notes (Signed)
   Subjective:    Patient ID: Joe Leaf., male    DOB: [birth year], 43 y.o.   MRN: [id number]  DOS:  11/27/2013 Type of visit - description : cpx Interval history: In general doing well. Was seen recently with LE numbness , symptoms are gone. He continued having a very healthy lifestyle   ROS Denies chest pain or difficulty breathing No nausea, vomiting, blood in the stools, had a single episode of diarrhea this week in the context of other family members having the same. No cough, bronchial congestion or sputum production No anxiety-depression No dysuria gross hematuria  Past Medical History  Diagnosis Date  . Ruptured lumbar disc     No surgery  . Migraine   . Syncope     w[facility]u done in  [facility]    Past Surgical History  Procedure Laterality Date  . Vasectomy  05/2013    History   Social History  . Marital Status: Married    Spouse Name: N/A    Number of Children: 2  . Years of Education: N/A   Occupational History  . [employer] [place] History Main Topics  . Smoking status: Never Smoker   . Smokeless tobacco: Never Used  . Alcohol Use: Yes     Comment: Socilly  . Drug Use: No  . Sexual Activity: Not on file   Other Topics Concern  . Not on file   Social History Narrative   House hold-- wife and 2 teenagers daughters     Family History  Problem Relation Age of Onset  . Diabetes Mother   . Hypertension Mother   . Heart disease Father     CHF  . Heart attack Father 50  . Prostate cancer Brother 68  . Colon cancer Neg Hx        Medication List       This list is accurate as of: 11/27/13 11:59 PM.  Always use your most recent med list.               fluticasone 50 MCG/ACT nasal spray  Commonly known as:  FLONASE  INSTILL 2 SPRAYS INTO THE NOSE DAILY prn     multivitamin tablet  Take 1 tablet by mouth daily.     NON FORMULARY  1 tablet daily. Calcium, magnesium, zinc           Objective:   Physical Exam BP  142/78  Pulse 81  Temp(Src) 97.5 F (36.4 C) (Oral)  Wt 220 lb 6 oz (99.961 kg)  SpO2 99%  General -- alert, well-developed, NAD.  Neck --no thyromegaly , normal carotid pulse  HEENT-- Not pale.   Lungs -- normal respiratory effort, no intercostal retractions, no accessory muscle use, and normal breath sounds.  Heart-- normal rate, regular rhythm, no murmur.  Abdomen-- Not distended, good bowel sounds,soft, non-tender. No rebound or rigidity. No mass,organomegaly.  Extremities-- no pretibial edema bilaterally  Neurologic--  alert & oriented X3. Speech normal, gait appropriate for age, strength symmetric and appropriate for age.  Psych-- Cognition and judgment appear intact. Cooperative with normal attention span and concentration. No anxious or depressed appearing.      Assessment & Plan:

## 2013-11-27 NOTE — Assessment & Plan Note (Addendum)
Td ~ 2009 per pt Flu shot-- had it last week +FH Prostate ca: DRE and PAS WNL 08-2012 (PSA stable compared to 2012)-- next screen 2016 Never had a cscope Diet-exercise (2 miles a day, treadmill) : doing well    Labs

## 2013-11-27 NOTE — Assessment & Plan Note (Signed)
No further episodes, Holter 09-2013 showed no  arrhythmia. Saw cardiology, they notice frequent PVCs but most likely not causing any symptoms, they needed to see the records from  [facility]---->   not available as of today, send another ROI

## 2013-12-02 ENCOUNTER — Encounter: Payer: Self-pay | Admitting: *Deleted

## 2013-12-16 ENCOUNTER — Telehealth: Payer: Self-pay | Admitting: Internal Medicine

## 2013-12-16 NOTE — Telephone Encounter (Signed)
Records from admission to [facility] 02/02/2013: Patient had a number of GI symptoms, then he had a syncopal episode. Cardiac enzymes negative CT head negative Carotid ultrasound 1-39% bilaterally The patient felt better after IV fluids and was discharged home. No records from the August 09 2013 admission noted at this time.

## 2013-12-24 ENCOUNTER — Ambulatory Visit (INDEPENDENT_AMBULATORY_CARE_PROVIDER_SITE_OTHER): Payer: BC Managed Care – PPO | Admitting: Internal Medicine

## 2013-12-24 ENCOUNTER — Encounter: Payer: Self-pay | Admitting: Internal Medicine

## 2013-12-24 VITALS — BP 153/85 | HR 75 | Temp 98.4°F | Wt 220.2 lb

## 2013-12-24 DIAGNOSIS — J069 Acute upper respiratory infection, unspecified: Secondary | ICD-10-CM

## 2013-12-24 NOTE — Progress Notes (Signed)
   Subjective:    Patient ID: Joe Leaf., male    DOB: 1970-04-11, 43 y.o.   MRN: [id number]  DOS:  12/24/2013 Type of visit - description : acute Interval history: Symptoms started 3 days ago with dry cough, fatigue, head congestion use, not sleeping well. Taking Tylenol Daughter is also sick with similar symptoms.   ROS No actual fever or chills Some bronchial congestion, no nasal discharge No nausea, vomiting, diarrhea No myalgias  Past Medical History  Diagnosis Date  . Ruptured lumbar disc     No surgery  . Migraine   . Syncope     [facility]u done in  [facility]    Past Surgical History  Procedure Laterality Date  . Vasectomy  05/2013    History   Social History  . Marital Status: Married    Spouse Name: N/A    Number of Children: 2  . Years of Education: N/A   Occupational History  . [employer] [place] History Main Topics  . Smoking status: Never Smoker   . Smokeless tobacco: Never Used  . Alcohol Use: Yes     Comment: Socilly  . Drug Use: No  . Sexual Activity: Not on file   Other Topics Concern  . Not on file   Social History Narrative   House hold-- wife and 2 teenagers daughters        Medication List       This list is accurate as of: 12/24/13  5:14 PM.  Always use your most recent med list.               CALCIUM-MAGNESIUM-ZINC PO  Take 1 tablet by mouth daily.     fluticasone 50 MCG/ACT nasal spray  Commonly known as:  FLONASE  INSTILL 2 SPRAYS INTO THE NOSE DAILY prn     multivitamin tablet  Take 1 tablet by mouth daily.           Objective:   Physical Exam BP 153/85 mmHg  Pulse 75  Temp(Src) 98.4 F (36.9 C) (Oral)  Wt 220 lb 4 oz (99.905 kg)  SpO2 97% General -- alert, well-developed, healthy-appearing in no distress    HEENT-- Not pale.  R Ear-- normal L ear-- normal Throat symmetric, no redness or discharge. Face symmetric, sinuses not tender to palpation. Nose slt congested. Lungs -- normal  respiratory effort, no intercostal retractions, no accessory muscle use, and normal breath sounds.  Heart-- normal rate, regular rhythm, no murmur.   Extremities-- no pretibial edema bilaterally  Neurologic--  alert & oriented X3. Speech normal, gait appropriate for age, strength symmetric and appropriate for age.  Psych-- Cognition and judgment appear intact. Cooperative with normal attention span and concentration. No anxious or depressed appearing.     Assessment & Plan:   URI, Recommend conservative treatment, see instruction, if not better consider antibiotics  BP slightly elevated, ambulatory BPs usually normal, recommend to monitor her BP. See instructions

## 2013-12-24 NOTE — Progress Notes (Signed)
Pre visit review using our clinic review tool, if applicable. No additional management support is needed unless otherwise documented below in the visit note. 

## 2013-12-24 NOTE — Patient Instructions (Signed)
Rest, fluids , tylenol If  cough, take Mucinex DM twice a day as needed  If nasal  congestion use OTC Nasocort: 2 nasal sprays on each side of the nose daily until you feel better  Call if not gradually better over the next  10 days Call anytime if the symptoms are severe   Check the  blood pressure monthly  Be sure your blood pressure is between  145/85  and 110/65.  if it is consistently higher or lower, let me know

## 2013-12-26 ENCOUNTER — Telehealth: Payer: Self-pay | Admitting: Internal Medicine

## 2013-12-26 MED ORDER — DOXYCYCLINE HYCLATE 100 MG PO TABS: 100.0000 mg | ORAL_TABLET | Freq: Two times a day (BID) | ORAL | Status: DC

## 2013-12-26 NOTE — Telephone Encounter (Signed)
Please advise 

## 2013-12-26 NOTE — Telephone Encounter (Signed)
Doxycycline sent to [facility].

## 2013-12-26 NOTE — Telephone Encounter (Signed)
Caller name:  Kotaro Relation to pt: self Call back number: [phone] Pharmacy: [facility] on [address] [facility] [address]  Reason for call:   Patient states that he is feeling much worse today than when he came in on Monday. His symptoms are more fatigue,nausea, uri, nasal congestion, sinus headaches, cough. Patient states that he tried taking mucinex DM but that did not help. Started [employer] and that seems to be helping but he states that the fatigue is so much that he slept all day yesterday.

## 2013-12-26 NOTE — Telephone Encounter (Signed)
Recommend rest, fluids, Tylenol, Mucinex DM. Use  Flonase 2 sprays in each side of the nose daily to help congestion.  Send a prescription for doxycycline 100 mg one by mouth twice a day #14 If symptoms severe during the  weekend, go to a urgent care; if not better by next week needs to be seen again

## 2013-12-26 NOTE — Telephone Encounter (Signed)
Spoke with Pt, informed him to use flonase for nasal congestion and abx that was sent to pharmacy. Informed Pt that if he continues feeling bad he will need to be seen or he will need to go to UC. Patient verbalized understanding.

## 2014-02-11 ENCOUNTER — Encounter: Payer: Self-pay | Admitting: Internal Medicine

## 2014-05-06 ENCOUNTER — Other Ambulatory Visit: Payer: Self-pay | Admitting: Internal Medicine

## 2014-05-19 ENCOUNTER — Other Ambulatory Visit: Payer: Self-pay

## 2014-12-01 ENCOUNTER — Telehealth: Payer: Self-pay | Admitting: Behavioral Health

## 2014-12-01 ENCOUNTER — Encounter: Payer: Self-pay | Admitting: Behavioral Health

## 2014-12-01 NOTE — Telephone Encounter (Signed)
Pt returning your call. Best # [phone].

## 2014-12-01 NOTE — Telephone Encounter (Signed)
Attempted to reach patient again for Pre-Visit Call.  Left message for patient to return call when available.

## 2014-12-01 NOTE — Telephone Encounter (Signed)
Unable to reach patient at time of Pre-Visit Call.  Left message for patient to return call when available.    

## 2014-12-01 NOTE — Addendum Note (Signed)
Addended by: Eduard Roux E on: 12/01/2014 02:44 PM   Modules accepted: Medications

## 2014-12-01 NOTE — Telephone Encounter (Signed)
Pre-Visit Call completed with patient and chart updated.   Pre-Visit Info documented in Specialty Comments under [employer].    

## 2014-12-02 ENCOUNTER — Encounter: Payer: Self-pay | Admitting: Internal Medicine

## 2014-12-02 ENCOUNTER — Ambulatory Visit (INDEPENDENT_AMBULATORY_CARE_PROVIDER_SITE_OTHER): Payer: PRIVATE HEALTH INSURANCE | Admitting: Internal Medicine

## 2014-12-02 VITALS — BP 124/74 | HR 75 | Temp 98.1°F | Ht 74.5 in | Wt 223.4 lb

## 2014-12-02 DIAGNOSIS — Z125 Encounter for screening for malignant neoplasm of prostate: Secondary | ICD-10-CM

## 2014-12-02 DIAGNOSIS — Z0000 Encounter for general adult medical examination without abnormal findings: Secondary | ICD-10-CM | POA: Diagnosis not present

## 2014-12-02 DIAGNOSIS — Z114 Encounter for screening for human immunodeficiency virus [HIV]: Secondary | ICD-10-CM

## 2014-12-02 LAB — COMPREHENSIVE METABOLIC PANEL
ALT: 17 U/L (ref 0–53)
AST: 18 U/L (ref 0–37)
Albumin: 4.4 g/dL (ref 3.5–5.2)
Alkaline Phosphatase: 52 U/L (ref 39–117)
BUN: 13 mg/dL (ref 6–23)
CO2: 31 mEq/L (ref 19–32)
Calcium: 9.9 mg/dL (ref 8.4–10.5)
Chloride: 103 mEq/L (ref 96–112)
Creatinine, Ser: 1.13 mg/dL (ref 0.40–1.50)
GFR: 74.04 mL/min (ref >60.00)
Glucose, Bld: 94 mg/dL (ref 70–99)
POTASSIUM: 4.2 meq/L (ref 3.5–5.1)
SODIUM: 140 meq/L (ref 135–145)
Total Bilirubin: 0.8 mg/dL (ref 0.2–1.2)
Total Protein: 7 g/dL (ref 6.0–8.3)

## 2014-12-02 LAB — LIPID PANEL
Cholesterol: 170 mg/dL (ref 0–200)
HDL: 39.4 mg/dL (ref >39.00)
LDL CALC: 106 mg/dL — AB (ref 0–99)
NONHDL: 130.88
Total CHOL/HDL Ratio: 4
Triglycerides: 122 mg/dL (ref 0.0–149.0)
VLDL: 24.4 mg/dL (ref 0.0–40.0)

## 2014-12-02 LAB — HIV ANTIBODY (ROUTINE TESTING W REFLEX): HIV 1&2 Ab, 4th Generation: NONREACTIVE

## 2014-12-02 LAB — PSA: PSA: 1.49 ng/mL (ref 0.10–4.00)

## 2014-12-02 NOTE — Progress Notes (Signed)
Subjective:    Patient ID: Joe Can., male    DOB: 1971-01-02, 44 y.o.   MRN: [id number]  DOS:  12/02/2014 Type of visit - description : CPX Interval history:doing well    Review of Systems  Constitutional: No fever. No chills. No unexplained wt changes. No unusual sweats  HEENT: No dental problems, no ear discharge, no facial swelling, no voice changes. No eye discharge, no eye  redness , no  intolerance to light   Respiratory: No wheezing , no  difficulty breathing. No cough , no mucus production  Cardiovascular: No CP, no leg swelling , no  Palpitations  GI: no nausea, no vomiting, no diarrhea , no  abdominal pain.  No blood in the stools. No dysphagia, no odynophagia    Endocrine: No polyphagia, no polyuria , no polydipsia  GU: No dysuria, gross hematuria, difficulty urinating. No urinary urgency, no frequency.  Musculoskeletal: No joint swellings or unusual aches or pains  Skin: No change in the color of the skin, palor , no  Rash  Allergic, immunologic: No environmental allergies , no  food allergies  Neurological: No dizziness no  syncope.   No diplopia, no slurred, no slurred speech, no motor deficits, no facial  Numbness Headaches at baseline, usually associated with mild nausea and respond to OTCs  Hematological: No enlarged lymph nodes, no easy bruising , no unusual bleedings  Psychiatry: No suicidal ideas, no hallucinations, no beavior problems, no confusion.  No unusual/severe anxiety, no depression  Past Medical History  Diagnosis Date  . Ruptured lumbar disc     No surgery  . Migraine   . Syncope     [facility]u done in  [facility]    Past Surgical History  Procedure Laterality Date  . Vasectomy  05/2013    Social History   Social History  . Marital Status: Married    Spouse Name: N/A  . Number of Children: 2  . Years of Education: N/A   Occupational History  . looking for a job    Social History Main Topics  . Smoking status: Never Smoker    . Smokeless tobacco: Never Used  . Alcohol Use: Yes     Comment: Socilly  . Drug Use: No  . Sexual Activity: Not on file   Other Topics Concern  . Not on file   Social History Narrative   House hold-- wife and 2 teenagers daughters   Pt's parents living w/ them     Family History  Problem Relation Age of Onset  . Diabetes Mother   . Hypertension Mother   . Heart disease Father     CHF  . Heart attack Father 63  . Prostate cancer Brother 40  . Colon cancer Neg Hx        Medication List       This list is accurate as of: 12/02/14  6:40 PM.  Always use your most recent med list.               CALCIUM-MAGNESIUM-ZINC PO  Take 1 tablet by mouth daily.     fluticasone 50 MCG/ACT nasal spray  Commonly known as:  FLONASE  INSTILL 2 SPRAYS INTO THE NOSE DAILY     multivitamin tablet  Take 1 tablet by mouth daily.           Objective:   Physical Exam BP 124/74 mmHg  Pulse 75  Temp(Src) 98.1 F (36.7 C) (Oral)  Ht 6' 2.5" (1.892  m)  Wt 223 lb 6 oz (101.322 kg)  BMI 28.30 kg/m2  SpO2 97% General:   Well developed, well nourished . NAD.  Neck:  Full range of motion. Supple. No  thyromegaly , normal carotid pulse HEENT:  Normocephalic . Face symmetric, atraumatic Lungs:  CTA B Normal respiratory effort, no intercostal retractions, no accessory muscle use. Heart: RRR,  no murmur.  No pretibial edema bilaterally  Abdomen:  Not distended, soft, non-tender. No rebound or rigidity.  Rectal:  External abnormalities: none. Normal sphincter tone. No rectal masses or tenderness.  Stool brown  Prostate: Prostate gland firm and smooth, no enlargement, nodularity, tenderness, mass, asymmetry or induration.  Skin: Exposed areas without rash. Not pale. Not jaundice Neurologic:  alert & oriented X3.  Speech normal, gait appropriate for age and unassisted Strength symmetric and appropriate for age.  Psych: Cognition and judgment appear intact.  Cooperative with  normal attention span and concentration.  Behavior appropriate. No anxious or depressed appearing.    Assessment & Plan:   Assessment > H/o  Migraines ---  On OTCs wit good response H/o Syncope : w/u @ [facility]:02/02/2013: (-) Cardiac enzymes and CT head; Carotid ultrasound 1-39% bilaterally, d/c after IVF +FH CAD   +FH  Prostate cancer

## 2014-12-02 NOTE — Progress Notes (Signed)
Pre visit review using our clinic review tool, if applicable. No additional management support is needed unless otherwise documented below in the visit note. 

## 2014-12-02 NOTE — Assessment & Plan Note (Addendum)
Td ~ 2009 per pt Had a Flu shot  +FH Prostate ca: DRE  Normal, check a  PSA   FH CAD - pt is asx  Never had a cscope Diet-exercise  : discussed  Labs

## 2014-12-02 NOTE — Patient Instructions (Signed)
Get your blood work before you leave     Next visit  for a complete physical exam in one year, fasting Please schedule an appointment at the front desk

## 2015-06-09 ENCOUNTER — Encounter: Payer: Self-pay | Admitting: Internal Medicine

## 2015-06-12 ENCOUNTER — Encounter: Payer: Self-pay | Admitting: Internal Medicine

## 2015-06-12 ENCOUNTER — Telehealth: Payer: Self-pay | Admitting: Family

## 2015-06-12 DIAGNOSIS — J111 Influenza due to unidentified influenza virus with other respiratory manifestations: Secondary | ICD-10-CM

## 2015-06-12 MED ORDER — OSELTAMIVIR PHOSPHATE 75 MG PO CAPS: 75.0000 mg | ORAL_CAPSULE | Freq: Two times a day (BID) | ORAL | Status: DC

## 2015-06-12 NOTE — Progress Notes (Signed)

## 2015-06-12 NOTE — Telephone Encounter (Signed)
Called and spoke with the pt to follow-up on the MyChart message regarding E-visit.  Pt stated that he received a call back regarding the E-visit.  He stated that everything has been taking care of.//AB/CMA

## 2015-09-24 ENCOUNTER — Telehealth: Payer: Self-pay

## 2015-09-24 NOTE — Telephone Encounter (Signed)
Fax received from [facility] for SN Effective 10/11/2015 1WK1, [id number].  Documents numbered and placed in Dr. Ethel Rana red folder for review and signature.

## 2015-09-25 NOTE — Telephone Encounter (Signed)
Form signed by PCP and faxed to [facility] at [phone]. Plan of care sent for scanning.

## 2015-11-06 ENCOUNTER — Telehealth: Payer: Self-pay | Admitting: Internal Medicine

## 2015-11-06 NOTE — Telephone Encounter (Signed)
Okay to use two slots. Please express importance of scheduling next CPE at time of office visit at check out that way he can get the time and date he wishes.

## 2015-11-06 NOTE — Telephone Encounter (Signed)
Patient states provider cancel 12/04/15 physical appointment. Patient scheduled for 02/09/16 at 8am.

## 2015-11-06 NOTE — Telephone Encounter (Signed)
Relation to [id number] Call back [phone]   Reason for call:  Patient last CPE 12/02/14 requesting a morning appointment please advise if 2 same day slots can be used or does he have to wait until next available which is 02/09/16 at 8am, please advise?

## 2015-12-04 ENCOUNTER — Encounter: Payer: PRIVATE HEALTH INSURANCE | Admitting: Internal Medicine

## 2016-01-14 ENCOUNTER — Encounter: Payer: Self-pay | Admitting: Internal Medicine

## 2016-02-07 ENCOUNTER — Other Ambulatory Visit: Payer: Self-pay | Admitting: Internal Medicine

## 2016-02-09 ENCOUNTER — Encounter: Payer: Self-pay | Admitting: Internal Medicine

## 2016-11-15 ENCOUNTER — Encounter: Payer: Self-pay | Admitting: Internal Medicine

## 2016-11-15 DIAGNOSIS — Z0000 Encounter for general adult medical examination without abnormal findings: Secondary | ICD-10-CM

## 2016-11-16 NOTE — Telephone Encounter (Signed)
Needs labs, see below  Received: Today  Message Contents  Paz, Alda Berthold, MD  Burgandy Hackworth, Vilma Prader, CMA        CMP, FLP, CBC, TSH, PSA, fasting.  Somebody needs to call the patient and make an appointment for blood work only to 3 days before physical

## 2016-12-06 ENCOUNTER — Other Ambulatory Visit (INDEPENDENT_AMBULATORY_CARE_PROVIDER_SITE_OTHER): Payer: PRIVATE HEALTH INSURANCE

## 2016-12-06 DIAGNOSIS — Z125 Encounter for screening for malignant neoplasm of prostate: Secondary | ICD-10-CM

## 2016-12-06 DIAGNOSIS — Z0000 Encounter for general adult medical examination without abnormal findings: Secondary | ICD-10-CM

## 2016-12-06 LAB — CBC WITH DIFFERENTIAL/PLATELET
BASOS PCT: 1.2 % (ref 0.0–3.0)
Basophils Absolute: 0 10*3/uL (ref 0.0–0.1)
EOS PCT: 4.2 % (ref 0.0–5.0)
Eosinophils Absolute: 0.2 10*3/uL (ref 0.0–0.7)
HEMATOCRIT: 46.9 % (ref 39.0–52.0)
HEMOGLOBIN: 16.1 g/dL (ref 13.0–17.0)
LYMPHS PCT: 30.5 % (ref 12.0–46.0)
Lymphs Abs: 1.1 10*3/uL (ref 0.7–4.0)
MCHC: 34.3 g/dL (ref 30.0–36.0)
MCV: 87.9 fl (ref 78.0–100.0)
MONO ABS: 0.3 10*3/uL (ref 0.1–1.0)
MONOS PCT: 8.4 % (ref 3.0–12.0)
Neutro Abs: 2.1 10*3/uL (ref 1.4–7.7)
Neutrophils Relative %: 55.7 % (ref 43.0–77.0)
Platelets: 301 10*3/uL (ref 150.0–400.0)
RBC: 5.33 Mil/uL (ref 4.22–5.81)
RDW: 12.9 % (ref 11.5–15.5)
WBC: 3.7 10*3/uL — AB (ref 4.0–10.5)

## 2016-12-06 LAB — TSH: TSH: 2.39 u[IU]/mL (ref 0.35–4.50)

## 2016-12-06 LAB — LIPID PANEL
Cholesterol: 186 mg/dL (ref 0–200)
HDL: 38.9 mg/dL — ABNORMAL LOW (ref >39.00)
LDL Cholesterol: 110 mg/dL — ABNORMAL HIGH (ref 0–99)
NONHDL: 146.97
Total CHOL/HDL Ratio: 5
Triglycerides: 183 mg/dL — ABNORMAL HIGH (ref 0.0–149.0)
VLDL: 36.6 mg/dL (ref 0.0–40.0)

## 2016-12-06 LAB — COMPREHENSIVE METABOLIC PANEL
ALT: 23 U/L (ref 0–53)
AST: 22 U/L (ref 0–37)
Albumin: 4.4 g/dL (ref 3.5–5.2)
Alkaline Phosphatase: 55 U/L (ref 39–117)
BILIRUBIN TOTAL: 0.8 mg/dL (ref 0.2–1.2)
BUN: 13 mg/dL (ref 6–23)
CHLORIDE: 104 meq/L (ref 96–112)
CO2: 29 meq/L (ref 19–32)
CREATININE: 1.07 mg/dL (ref 0.40–1.50)
Calcium: 9.8 mg/dL (ref 8.4–10.5)
GFR: 78.18 mL/min (ref >60.00)
GLUCOSE: 95 mg/dL (ref 70–99)
Potassium: 4.3 mEq/L (ref 3.5–5.1)
SODIUM: 141 meq/L (ref 135–145)
Total Protein: 7.1 g/dL (ref 6.0–8.3)

## 2016-12-06 LAB — PSA: PSA: 1.87 ng/mL (ref 0.10–4.00)

## 2016-12-09 ENCOUNTER — Encounter: Payer: Self-pay | Admitting: Internal Medicine

## 2016-12-09 ENCOUNTER — Ambulatory Visit (INDEPENDENT_AMBULATORY_CARE_PROVIDER_SITE_OTHER): Payer: PRIVATE HEALTH INSURANCE | Admitting: Internal Medicine

## 2016-12-09 VITALS — BP 126/70 | HR 73 | Temp 98.1°F | Resp 14 | Ht 74.5 in | Wt 234.2 lb

## 2016-12-09 DIAGNOSIS — R5383 Other fatigue: Secondary | ICD-10-CM | POA: Diagnosis not present

## 2016-12-09 DIAGNOSIS — I493 Ventricular premature depolarization: Secondary | ICD-10-CM | POA: Diagnosis not present

## 2016-12-09 DIAGNOSIS — Z0000 Encounter for general adult medical examination without abnormal findings: Secondary | ICD-10-CM

## 2016-12-09 DIAGNOSIS — Z23 Encounter for immunization: Secondary | ICD-10-CM | POA: Diagnosis not present

## 2016-12-09 DIAGNOSIS — G43909 Migraine, unspecified, not intractable, without status migrainosus: Secondary | ICD-10-CM

## 2016-12-09 MED ORDER — SUMATRIPTAN SUCCINATE 50 MG PO TABS: ORAL_TABLET | ORAL | 0 refills | Status: DC

## 2016-12-09 NOTE — Patient Instructions (Signed)
  GO TO THE FRONT DESK Schedule your next appointment for a  Check up in 4 months   

## 2016-12-09 NOTE — Progress Notes (Signed)
Subjective:    Patient ID: Joe Can., male    DOB: 03-Jan-1971, 46 y.o.   MRN: [id number]  DOS:  12/09/2016 Type of visit - description : cpx Interval history: Has several concerns.  See review of systems  Review of Systems -Long history of headaches, has been persistent for the last 2 years, having approximately 2 episodes a month.  Usually on the right side, associated with nausea. Previously, Excedrin helps significantly, now Excedrin helps with the headache but not the nausea. -Started a new job 6 months ago, it is quite demanding and he is feeling it. He is fatigued at the end of the day but sates he  wakes up at 5 AM and  work "all day long".  When asked, admits that he does not think the fatigue is out of proportion to the amount of work he does. Admits to snoring. -Nocturia x1 lately.  No dysuria, gross hematuria difficulty urinating  Other than above, a 14 point review of systems is negative     Past Medical History:  Diagnosis Date  . Migraine   . Ruptured lumbar disc    No surgery  . Syncope    w/u done in  [facility]    Past Surgical History:  Procedure Laterality Date  . VASECTOMY  05/2013    Social History   Socioeconomic History  . Marital status: Married    Spouse name: Not on file  . Number of children: 2  . Years of education: Not on file  . Highest education level: Not on file  Social Needs  . Financial resource strain: Not on file  . Food insecurity - worry: Not on file  . Food insecurity - inability: Not on file  . Transportation needs - medical: Not on file  . Transportation needs - non-medical: Not on file  Occupational History  . Occupation: works for the [employer]  . Smoking status: Never Smoker  . Smokeless tobacco: Never Used  Substance and Sexual Activity  . Alcohol use: Yes    Comment: Socilly  . Drug use: No  . Sexual activity: Not on file  Other Topics Concern  . Not on file  Social History Narrative   House hold: pt and wife and pt's parents    2 daughters in college      Family History  Problem Relation Age of Onset  . Diabetes Mother   . Hypertension Mother   . Heart disease Father        CHF  . Heart attack Father 49  . Prostate cancer Brother 84  . Colon cancer Neg Hx      Allergies as of 12/09/2016      Reactions   Hydrocodone Nausea And Vomiting      Medication List        Accurate as of 12/09/16 11:59 PM. Always use your most recent med list.          multivitamin tablet Take 1 tablet by mouth daily.   SUMAtriptan 50 MG tablet Commonly known as:  IMITREX 1 tablet w/ onset of headache,  may repeat in 2 hours if headache persists or recurs.          Objective:   Physical Exam BP 126/70 (BP Location: Left Arm, Patient Position: Sitting, Cuff Size: Small)   Pulse 73   Temp 98.1 F (36.7 C) (Oral)   Resp 14   Ht 6' 2.5" (1.892 m)  Wt 234 lb 4 oz (106.3 kg)   SpO2 96%   BMI 29.67 kg/m  General:   Well developed, well nourished . NAD.  Neck: No  thyromegaly  HEENT:  Normocephalic . Face symmetric, atraumatic Neck: No thyromegaly Lungs:  CTA B Normal respiratory effort, no intercostal retractions, no accessory muscle use. Heart: RRR,  no murmur.  No pretibial edema bilaterally  Abdomen:  Not distended, soft, non-tender. No rebound or rigidity.   Rectal:  External abnormalities: none. Normal sphincter tone. No rectal masses or tenderness.  No stools found Prostate: Prostate gland firm and smooth, no enlargement, nodularity, tenderness, mass, asymmetry or induration.  Skin: Exposed areas without rash. Not pale. Not jaundice Neurologic:  alert & oriented X3.  Speech normal, gait appropriate for age and unassisted Strength symmetric and appropriate for age.  Psych: Cognition and judgment appear intact.  Cooperative with normal attention span and concentration.  Behavior appropriate. No anxious or depressed appearing.     Assessment &  Plan:    Assessment H/o migraines H/o syncope, saw cards  ++ PVCs per holter 2015  PLAN  Migraines: Chronic, in the last few weeks has noted that Excedrin helps with the pain but not with nausea.  Had a CT head  2014 for a syncope: Normal. Never formally eval by neurology. Today, motor exam, gait and speech are wnl. Discussed Topamax, neurology referral , MRI, he would like to stay on prn meds. Plan: Sumatriptan prn.  Okay to take  Excedrin also. To call if s/e, or  If has severe or unusual headache. Fatigue: Recent labs normal, epworth scored 6 (-), rec observation. H/o + PVCs: Currently asx, pt still concerned, CV exam neg, Rx obs for now. RTC 4 months.

## 2016-12-09 NOTE — Progress Notes (Signed)
Pre visit review using our clinic review tool, if applicable. No additional management support is needed unless otherwise documented below in the visit note. 

## 2016-12-09 NOTE — Assessment & Plan Note (Signed)
-  Tdap today, Had a Flu shot  -+FH Prostate ca: DRE  Normal today, recent PSA wnl   -FH CAD - pt is asx  -Never had a cscope -Labs done few days ago reviewed w/ pt: wnl

## 2016-12-11 DIAGNOSIS — Z09 Encounter for follow-up examination after completed treatment for conditions other than malignant neoplasm: Secondary | ICD-10-CM | POA: Insufficient documentation

## 2016-12-11 DIAGNOSIS — I493 Ventricular premature depolarization: Secondary | ICD-10-CM | POA: Insufficient documentation

## 2016-12-11 NOTE — Assessment & Plan Note (Signed)
Migraines: Chronic, in the last few weeks has noted that Excedrin helps with the pain but not with nausea.  Had a CT head  2014 for a syncope: Normal. Never formally eval by neurology. Today, motor exam, gait and speech are wnl. Discussed Topamax, neurology referral , MRI, he would like to stay on prn meds. Plan: Sumatriptan prn.  Okay to take  Excedrin also. To call if s/e, or  If has severe or unusual headache. Fatigue: Recent labs normal, epworth scored 6 (-), rec observation. H/o + PVCs: Currently asx, pt still concerned, CV exam neg, Rx obs for now. RTC 4 months.

## 2017-01-18 DIAGNOSIS — M5137 Other intervertebral disc degeneration, lumbosacral region: Secondary | ICD-10-CM | POA: Insufficient documentation

## 2017-01-18 DIAGNOSIS — M51379 Other intervertebral disc degeneration, lumbosacral region without mention of lumbar back pain or lower extremity pain: Secondary | ICD-10-CM | POA: Insufficient documentation

## 2017-04-14 ENCOUNTER — Ambulatory Visit: Payer: PRIVATE HEALTH INSURANCE | Admitting: Internal Medicine

## 2017-10-08 ENCOUNTER — Other Ambulatory Visit: Payer: Self-pay | Admitting: Internal Medicine

## 2017-12-20 ENCOUNTER — Encounter: Payer: Self-pay | Admitting: Internal Medicine

## 2017-12-20 ENCOUNTER — Ambulatory Visit (INDEPENDENT_AMBULATORY_CARE_PROVIDER_SITE_OTHER): Payer: PRIVATE HEALTH INSURANCE | Admitting: Internal Medicine

## 2017-12-20 VITALS — BP 126/74 | HR 72 | Temp 98.3°F | Resp 16 | Ht 75.0 in | Wt 232.2 lb

## 2017-12-20 DIAGNOSIS — G43909 Migraine, unspecified, not intractable, without status migrainosus: Secondary | ICD-10-CM | POA: Diagnosis not present

## 2017-12-20 DIAGNOSIS — Z0000 Encounter for general adult medical examination without abnormal findings: Secondary | ICD-10-CM | POA: Diagnosis not present

## 2017-12-20 MED ORDER — ONDANSETRON 4 MG PO TBDP: 4.0000 mg | ORAL_TABLET | Freq: Three times a day (TID) | ORAL | 0 refills | Status: DC | PRN

## 2017-12-20 NOTE — Patient Instructions (Signed)
Get your labs tomorrow   GO TO THE FRONT DESK Schedule your next appointment for a  Physical exam in 1 year

## 2017-12-20 NOTE — Progress Notes (Signed)
Subjective:    Patient ID: Joe Can., male    DOB: 1971-01-29, 47 y.o.   MRN: [id number]  DOS:  12/20/2017 Type of visit - description : cpx Interval history: He has few concerns. Continue with headache, sumatriptan helps with a headache but not within nausea which has been a little more persistent lately. Sleep apnea?  The patient has severe snoring, wonders if testing is indicated.  His energy is fair and does not feel sleepy in general. Sometimes wakes up at night and is hard for him to go back to sleep.  Review of Systems   Other than above, a 14 point review of systems is negative    Past Medical History:  Diagnosis Date  . Migraine   . Ruptured lumbar disc    No surgery  . Syncope    w/u done in  [place]    Past Surgical History:  Procedure Laterality Date  . VASECTOMY  05/2013    Social History   Socioeconomic History  . Marital status: Married    Spouse name: Not on file  . Number of children: 2  . Years of education: Not on file  . Highest education level: Not on file  Occupational History  . Occupation: works for the [employer]  . Financial resource strain: Not on file  . Food insecurity:    Worry: Not on file    Inability: Not on file  . Transportation needs:    Medical: Not on file    Non-medical: Not on file  Tobacco Use  . Smoking status: Never Smoker  . Smokeless tobacco: Never Used  Substance and Sexual Activity  . Alcohol use: Yes    Comment: Socilly  . Drug use: No  . Sexual activity: Not on file  Lifestyle  . Physical activity:    Days per week: Not on file    Minutes per session: Not on file  . Stress: Not on file  Relationships  . Social connections:    Talks on phone: Not on file    Gets together: Not on file    Attends religious service: Not on file    Active member of club or organization: Not on file    Attends meetings of clubs or organizations: Not on file    Relationship status: Not on file    . Intimate partner violence:    Fear of current or ex partner: Not on file    Emotionally abused: Not on file    Physically abused: Not on file    Forced sexual activity: Not on file  Other Topics Concern  . Not on file  Social History Narrative   House hold: pt and wife and [name], LOST HIS MOTHER 04/2017   2 daughters in college      Family History  Problem Relation Age of Onset  . Diabetes Mother   . Hypertension Mother   . Heart failure Mother   . Heart disease Father        CHF  . Heart attack Father 65  . Heart failure Father   . Prostate cancer Brother 41  . Colon cancer Neg Hx      Allergies as of 12/20/2017      Reactions   Hydrocodone Nausea And Vomiting      Medication List        Accurate as of 12/20/17 11:59 PM. Always use your most recent med list.  multivitamin tablet Take 1 tablet by mouth daily.   ondansetron 4 MG disintegrating tablet Commonly known as:  ZOFRAN-ODT Take 1 tablet (4 mg total) by mouth every 8 (eight) hours as needed for nausea or vomiting.   SUMAtriptan 50 MG tablet Commonly known as:  IMITREX TAKE 1 TABLET WITH ONSET OF HEADACHE, MAY REPEAT IN 2 HOURS IF HEADACHE PERSISTS OR RECURS          Objective:   Physical Exam BP 126/74 (BP Location: Left Arm, Patient Position: Sitting, Cuff Size: Normal)   Pulse 72   Temp 98.3 F (36.8 C) (Oral)   Resp 16   Ht 6\' 3"  (1.905 m)   Wt 232 lb 4 oz (105.3 kg)   SpO2 98%   BMI 29.03 kg/m  General: Well developed, NAD, BMI noted Neck: No  thyromegaly  HEENT:  Normocephalic . Face symmetric, atraumatic Lungs:  CTA B Normal respiratory effort, no intercostal retractions, no accessory muscle use. Heart: RRR,  no murmur.  No pretibial edema bilaterally  Abdomen:  Not distended, soft, non-tender. No rebound or rigidity.   Skin: Exposed areas without rash. Not pale. Not jaundice Neurologic:  alert & oriented X3.  Speech normal, gait appropriate for age and  unassisted Strength symmetric and appropriate for age.  Psych: Cognition and judgment appear intact.  Cooperative with normal attention span and concentration.  Behavior appropriate. No anxious or depressed appearing.     Assessment & Plan:   Assessment H/o migraines H/o syncope, saw cards  ++ PVCs per holter 2015  PLAN  Chronic migraines: Good response to sumatriptan but lately nausea is more persistent.  Never been formally evaluated, refer to neurology, add Zofran. Snoring: as above, Epwoth scale= 8, average.  No much evidence of actual sleep apnea, he could diminish his snoring by using a dental appliance.  Will reassess yearly. RTC 1 year

## 2017-12-20 NOTE — Progress Notes (Signed)
Pre visit review using our clinic review tool, if applicable. No additional management support is needed unless otherwise documented below in the visit note. 

## 2017-12-20 NOTE — Assessment & Plan Note (Signed)
-  Tdap 2018, Had a Flu shot  - CCS: Never had a cscope -+FH Prostate ca: DRE   PSA wnl  2018 -FH CAD - pt is asx  -Labs: CMP, FLP, CBC, TSH -diet,exercise discussed

## 2017-12-21 ENCOUNTER — Other Ambulatory Visit (INDEPENDENT_AMBULATORY_CARE_PROVIDER_SITE_OTHER): Payer: PRIVATE HEALTH INSURANCE

## 2017-12-21 DIAGNOSIS — Z0000 Encounter for general adult medical examination without abnormal findings: Secondary | ICD-10-CM

## 2017-12-21 LAB — CBC WITH DIFFERENTIAL/PLATELET
BASOS ABS: 0 10*3/uL (ref 0.0–0.1)
BASOS PCT: 1.2 % (ref 0.0–3.0)
EOS ABS: 0.2 10*3/uL (ref 0.0–0.7)
Eosinophils Relative: 4.5 % (ref 0.0–5.0)
HCT: 45.7 % (ref 39.0–52.0)
Hemoglobin: 16 g/dL (ref 13.0–17.0)
LYMPHS ABS: 1.2 10*3/uL (ref 0.7–4.0)
Lymphocytes Relative: 33.2 % (ref 12.0–46.0)
MCHC: 35.1 g/dL (ref 30.0–36.0)
MCV: 87 fl (ref 78.0–100.0)
Monocytes Absolute: 0.3 10*3/uL (ref 0.1–1.0)
Monocytes Relative: 9.4 % (ref 3.0–12.0)
NEUTROS ABS: 1.9 10*3/uL (ref 1.4–7.7)
NEUTROS PCT: 51.7 % (ref 43.0–77.0)
PLATELETS: 282 10*3/uL (ref 150.0–400.0)
RBC: 5.25 Mil/uL (ref 4.22–5.81)
RDW: 12.9 % (ref 11.5–15.5)
WBC: 3.6 10*3/uL — ABNORMAL LOW (ref 4.0–10.5)

## 2017-12-21 LAB — COMPREHENSIVE METABOLIC PANEL WITH GFR
ALT: 17 U/L (ref 0–53)
AST: 18 U/L (ref 0–37)
Albumin: 4.5 g/dL (ref 3.5–5.2)
Alkaline Phosphatase: 51 U/L (ref 39–117)
BUN: 16 mg/dL (ref 6–23)
CO2: 31 meq/L (ref 19–32)
Calcium: 9.8 mg/dL (ref 8.4–10.5)
Chloride: 104 meq/L (ref 96–112)
Creatinine, Ser: 1.21 mg/dL (ref 0.40–1.50)
GFR: 67.54 mL/min
Glucose, Bld: 99 mg/dL (ref 70–99)
Potassium: 4.4 meq/L (ref 3.5–5.1)
Sodium: 141 meq/L (ref 135–145)
Total Bilirubin: 0.8 mg/dL (ref 0.2–1.2)
Total Protein: 7 g/dL (ref 6.0–8.3)

## 2017-12-21 LAB — LIPID PANEL
Cholesterol: 179 mg/dL (ref 0–200)
HDL: 38.1 mg/dL — ABNORMAL LOW
LDL Cholesterol: 112 mg/dL — ABNORMAL HIGH (ref 0–99)
NonHDL: 140.63
Total CHOL/HDL Ratio: 5
Triglycerides: 143 mg/dL (ref 0.0–149.0)
VLDL: 28.6 mg/dL (ref 0.0–40.0)

## 2017-12-21 LAB — TSH: TSH: 2.16 u[IU]/mL (ref 0.35–4.50)

## 2017-12-21 NOTE — Assessment & Plan Note (Signed)
Chronic migraines: Good response to sumatriptan but lately nausea is more persistent.  Never been formally evaluated, refer to neurology, add Zofran. Snoring: as above, Epwoth scale= 8, average.  No much evidence of actual sleep apnea, he could diminish his snoring by using a dental appliance.  Will reassess yearly. RTC 1 year

## 2018-01-25 ENCOUNTER — Other Ambulatory Visit: Payer: Self-pay | Admitting: Internal Medicine

## 2018-02-15 ENCOUNTER — Encounter: Payer: Self-pay | Admitting: Internal Medicine

## 2018-02-16 MED ORDER — SUMATRIPTAN SUCCINATE 50 MG PO TABS: ORAL_TABLET | ORAL | 2 refills | Status: DC

## 2018-02-16 NOTE — Addendum Note (Signed)
Addended byDamita Dunnings D on: 02/16/2018 09:42 AM   Modules accepted: Orders

## 2018-02-26 ENCOUNTER — Encounter: Payer: Self-pay | Admitting: Neurology

## 2018-02-26 ENCOUNTER — Telehealth: Payer: Self-pay | Admitting: Neurology

## 2018-02-26 ENCOUNTER — Telehealth: Payer: Self-pay

## 2018-02-26 ENCOUNTER — Ambulatory Visit (INDEPENDENT_AMBULATORY_CARE_PROVIDER_SITE_OTHER): Payer: No Typology Code available for payment source | Admitting: Neurology

## 2018-02-26 ENCOUNTER — Ambulatory Visit: Payer: Self-pay | Admitting: Neurology

## 2018-02-26 VITALS — BP 138/92 | HR 73 | Ht 75.5 in | Wt 236.0 lb

## 2018-02-26 DIAGNOSIS — R519 Headache, unspecified: Secondary | ICD-10-CM

## 2018-02-26 DIAGNOSIS — R51 Headache: Secondary | ICD-10-CM

## 2018-02-26 DIAGNOSIS — R0681 Apnea, not elsewhere classified: Secondary | ICD-10-CM

## 2018-02-26 DIAGNOSIS — R0683 Snoring: Secondary | ICD-10-CM

## 2018-02-26 NOTE — Patient Instructions (Signed)
Magnesium citrate 400mg  to 600mg  daily, riboflavin 400mg  daily, Coenzyme Q 10 100mg  three times daily   Migraine Headache A migraine headache is an intense, throbbing pain on one side or both sides of the head. Migraines may also cause other symptoms, such as nausea, vomiting, and sensitivity to light and noise. What are the causes? Doing or taking certain things may also trigger migraines, such as:  Alcohol.  Smoking.  Medicines, such as: ? Medicine used to treat chest pain (nitroglycerine). ? Birth control pills. ? Estrogen pills. ? Certain blood pressure medicines.  Aged cheeses, chocolate, or caffeine.  Foods or drinks that contain nitrates, glutamate, aspartame, or tyramine.  Physical activity. Other things that may trigger a migraine include:  Menstruation.  Pregnancy.  Hunger.  Stress, lack of sleep, too much sleep, or fatigue.  Weather changes. What increases the risk? The following factors may make you more likely to experience migraine headaches:  Age. Risk increases with age.  Family history of migraine headaches.  Being Caucasian.  Depression and anxiety.  Obesity.  Being a woman.  Having a hole in the heart (patent foramen ovale) or other heart problems. What are the signs or symptoms? The main symptom of this condition is pulsating or throbbing pain. Pain may:  Happen in any area of the head, such as on one side or both sides.  Interfere with daily activities.  Get worse with physical activity.  Get worse with exposure to bright lights or loud noises. Other symptoms may include:  Nausea.  Vomiting.  Dizziness.  General sensitivity to bright lights, loud noises, or smells. Before you get a migraine, you may get warning signs that a migraine is developing (aura). An aura may include:  Seeing flashing lights or having blind spots.  Seeing bright spots, halos, or zigzag lines.  Having tunnel vision or blurred vision.  Having  numbness or a tingling feeling.  Having trouble talking.  Having muscle weakness. How is this diagnosed? A migraine headache can be diagnosed based on:  Your symptoms.  A physical exam.  Tests, such as CT scan or MRI of the head. These imaging tests can help rule out other causes of headaches.  Taking fluid from the spine (lumbar puncture) and analyzing it (cerebrospinal fluid analysis, or CSF analysis). How is this treated? A migraine headache is usually treated with medicines that:  Relieve pain.  Relieve nausea.  Prevent migraines from coming back. Treatment may also include:  Acupuncture.  Lifestyle changes like avoiding foods that trigger migraines. Follow these instructions at home: Medicines  Take over-the-counter and prescription medicines only as told by your health care provider.  Do not drive or use heavy machinery while taking prescription pain medicine.  To prevent or treat constipation while you are taking prescription pain medicine, your health care provider may recommend that you: ? Drink enough fluid to keep your urine clear or pale yellow. ? Take over-the-counter or prescription medicines. ? Eat foods that are high in fiber, such as fresh fruits and vegetables, whole grains, and beans. ? Limit foods that are high in fat and processed sugars, such as fried and sweet foods. Lifestyle  Avoid alcohol use.  Do not use any products that contain nicotine or tobacco, such as cigarettes and e-cigarettes. If you need help quitting, ask your health care provider.  Get at least 8 hours of sleep every night.  Limit your stress. General instructions      Keep a journal to find out what may trigger  your migraine headaches. For example, write down: ? What you eat and drink. ? How much sleep you get. ? Any change to your diet or medicines.  If you have a migraine: ? Avoid things that make your symptoms worse, such as bright lights. ? It may help to lie  down in a dark, quiet room. ? Do not drive or use heavy machinery. ? Ask your health care provider what activities are safe for you while you are experiencing symptoms.  Keep all follow-up visits as told by your health care provider. This is important. Contact a health care provider if:  You develop symptoms that are different or more severe than your usual migraine symptoms. Get help right away if:  Your migraine becomes severe.  You have a fever.  You have a stiff neck.  You have vision loss.  Your muscles feel weak or like you cannot control them.  You start to lose your balance often.  You develop trouble walking.  You faint. This information is not intended to replace advice given to you by your health care provider. Make sure you discuss any questions you have with your health care provider. Document Released: 01/24/2005 Document Revised: 08/14/2015 Document Reviewed: 07/13/2015 Elsevier Interactive Patient Education  2019 [employer].

## 2018-02-26 NOTE — Telephone Encounter (Signed)
Called patient to offer him a sleep consult that became available sooner per MD. Left voicemail for patient to call us back if he would like this appt.

## 2018-02-26 NOTE — Telephone Encounter (Signed)
Yes I will. Does a referral need to be put in on the pt?

## 2018-02-26 NOTE — Progress Notes (Signed)
[facility]    Provider:  Dr Joe Castro Referring Provider: Colon Branch, MD Primary Care Physician:  Colon Branch, MD  CC:  Morning headaches.  HPI:  Joe Castro. is a 48 y.o. male here as requested by Dr. Larose Castro for migraines. He feels they are getting worse. Started several years ago.  He wakes up in the morning with headaches. He snores a lot. His wife says he snores heavily. He stops breathing in the middle of the night, she has to shake him. Needs sleep evaluation.  Patient said his wife made sure to tell him to mention the snoring and that he stops breathing in the middle of the night and this is really concerning.  Patient talked about this with his primary care but an Epworth sleepiness scale was not high enough to consider sleep apnea.  I discussed with patient and even though his ESS is not elevated I do think that heavy snoring, morning headaches, and witnessed apneic events are significant enough to screen for sleep apnea which can cause plethora of sequelae including stroke.  - Patient said his wife made sure to tell him to mention the snoring and that he stops breathing in the middle of the night and this is really concerning.  Patient talked about this with his primary care but an Epworth sleepiness scale was not high enough to consider sleep apnea.  I discussed with patient and even though his ESS is not elevated I do think that heavy snoring, morning headaches, and witnessed apneic events are significant enough to screen for sleep apnea which can cause plethora of sequelae including stroke.  -He has a large neck, he is large but not necessarily obese, however I cannot see his uvula or the back of his throat when he opens his mouth.  Will refer to our sleep team.  -I will not charge patient for today's appointment.  However if his sleep study is negative he can return to me for management of his migraines.  At this time I would continue his current acute  management.  -I did speak with Dr. Guadelupe Castro nurse to see if we had any upcoming appointments since patient has waited several months to see me I take for see him wait several months for sleep team as well however this depends on availability and they will try.   Social History   Socioeconomic History  . Marital status: Married    Spouse name: Not on file  . Number of children: 2  . Years of education: Not on file  . Highest education level: Bachelor's degree (e.g., BA, AB, BS)  Occupational History  . Occupation: works for the [employer]  . Financial resource strain: Not on file  . Food insecurity:    Worry: Not on file    Inability: Not on file  . Transportation needs:    Medical: Not on file    Non-medical: Not on file  Tobacco Use  . Smoking status: Never Smoker  . Smokeless tobacco: Never Used  Substance and Sexual Activity  . Alcohol use: Yes    Comment: social  . Drug use: No  . Sexual activity: Not on file  Lifestyle  . Physical activity:    Days per week: Not on file    Minutes per session: Not on file  . Stress: Not on file  Relationships  . Social connections:    Talks on phone: Not on file  Gets together: Not on file    Attends religious service: Not on file    Active member of club or organization: Not on file    Attends meetings of clubs or organizations: Not on file    Relationship status: Not on file  . Intimate partner violence:    Fear of current or ex partner: Not on file    Emotionally abused: Not on file    Physically abused: Not on file    Forced sexual activity: Not on file  Other Topics Concern  . Not on file  Social History Narrative   House hold: pt and wife and [name], LOST HIS MOTHER 04/2017. His father lives with them.   2 daughters in college    Right handed   Caffeine: daily coffee 1 cup    Family History  Problem Relation Age of Onset  . Diabetes Mother   . Hypertension Mother   . Heart failure Mother    . Kidney failure Mother   . Heart disease Father        CHF  . Heart attack Father 30  . Heart failure Father   . Kidney failure Father        CKD stage 3, no dialysis   . Prostate cancer Brother 55  . Colon cancer Neg Hx   . Migraines Neg Hx   . Headache Neg Hx     Past Medical History:  Diagnosis Date  . Migraine   . Ruptured lumbar disc    No surgery  . Syncope    w/u done in  [facility]    Past Surgical History:  Procedure Laterality Date  . VASECTOMY  05/2013    Current Outpatient Medications  Medication Sig Dispense Refill  . Multiple Vitamin (MULTIVITAMIN) tablet Take 1 tablet by mouth daily.    . ondansetron (ZOFRAN ODT) 4 MG disintegrating tablet Take 1 tablet (4 mg total) by mouth every 8 (eight) hours as needed for nausea or vomiting. 40 tablet 0  . SUMAtriptan (IMITREX) 50 MG tablet TAKE 1 TABLET BY MOUTH WITH ONSET OF HEADACHE, MAY REPEAT IN 2 HOURS IF HEADACHES PERSISTS 20 tablet 2   No current facility-administered medications for this visit.     Allergies as of 02/26/2018 - Review Complete 02/26/2018  Allergen Reaction Noted  . Hydrocodone Nausea Only 10/04/2013    Vitals: BP (!) 138/92 (BP Location: Right Arm, Patient Position: Sitting)   Pulse 73   Ht 6' 3.5" (1.918 m)   Wt 236 lb (107 kg)   BMI 29.11 kg/m  Last Weight:  Wt Readings from Last 1 Encounters:  02/26/18 236 lb (107 kg)   Last Height:   Ht Readings from Last 1 Encounters:  02/26/18 6' 3.5" (1.918 m)    Physical exam: Exam: Gen: NAD, conversant, well nourised, obese, well groomed                     CV: RRR, no MRG. No Carotid Bruits. No peripheral edema, warm, nontender Eyes: Conjunctivae clear without exudates or hemorrhage  Neuro: Detailed Neurologic Exam  Speech:    Speech is normal; fluent and spontaneous with normal comprehension.  Cognition:    The patient is oriented to person, place, and time;     recent and remote memory intact;     language fluent;     normal  attention, concentration,     fund of knowledge Cranial Nerves:    The pupils are equal, round, and reactive  to light. The fundi are normal and spontaneous venous pulsations are present. Visual fields are full to finger confrontation. Extraocular movements are intact. Trigeminal sensation is intact and the muscles of mastication are normal. The face is symmetric. The palate elevates in the midline. Hearing intact. Voice is normal. Shoulder shrug is normal. The tongue has normal motion without fasciculations.   Coordination:    Normal finger to nose and heel to shin. Normal rapid alternating movements.   Gait:    Heel-toe and tandem gait are normal.   Motor Observation:    No asymmetry, no atrophy, and no involuntary movements noted. Tone:    Normal muscle tone.    Posture:    Posture is normal. normal erect    Strength:    Strength is V/V in the upper and lower limbs.      Sensation: intact to LT     Reflex Exam:  DTR's:    Deep tendon reflexes in the upper and lower extremities are normal bilaterally.   Toes:    The toes are downgoing bilaterally.   Clonus:    Clonus is absent.   Assessment/Plan: Very nice 48 year old patient here for headaches.  - Patient said his wife made sure to tell him to mention the snoring and that he stops breathing in the middle of the night and this is really concerning.  Patient talked about this with his primary care but an Epworth sleepiness scale was not high enough to consider sleep apnea.  I discussed with patient and even though his ESS is not elevated I do think that heavy snoring, morning headaches, and witnessed apneic events are significant enough to screen for sleep apnea which can cause plethora of sequelae including stroke.  -He has a large neck, he is large but not necessarily obese, however I cannot see his uvula or the back of his throat when he opens his mouth.  Will refer to our sleep team.  -I will not charge patient for today's  appointment.  However if his sleep study is negative he can return to me for management of his migraines.  At this time I would continue his current acute management.  -I did speak with Dr. Guadelupe Castro nurse to see if we had any upcoming appointments since patient has waited several months to see me I take for see him wait several months for sleep team as well however this depends on availability and they will try.  -Much appreciate my colleagues in the sleep team.     Sarina Ill, MD  [facility] [address] [place], [place] [address]  Phone [phone] Fax [phone]

## 2018-02-26 NOTE — Telephone Encounter (Signed)
Pt declined the appt for tomorrow. Will keep him on the waitlist.

## 2018-02-26 NOTE — Telephone Encounter (Signed)
Pt presented today for his appt with Dr. Jaynee Eagles. Dr. Jaynee Eagles reports that she feels like pt's sleep needs to be evaluated first. She would like me to schedule this pt with the first available sleep consult with Dr. Rexene Alberts for sleep testing. If the sleep study is negative, Dr. Jaynee Eagles says that she will see the pt in follow up for his headache care.

## 2018-02-27 ENCOUNTER — Encounter: Payer: Self-pay | Admitting: Neurology

## 2018-02-27 DIAGNOSIS — R51 Headache: Secondary | ICD-10-CM

## 2018-02-27 DIAGNOSIS — R0683 Snoring: Secondary | ICD-10-CM | POA: Insufficient documentation

## 2018-02-27 DIAGNOSIS — R0681 Apnea, not elsewhere classified: Secondary | ICD-10-CM | POA: Insufficient documentation

## 2018-02-27 DIAGNOSIS — R519 Headache, unspecified: Secondary | ICD-10-CM | POA: Insufficient documentation

## 2018-02-27 NOTE — Addendum Note (Signed)
Addended by: Lester Wilton A on: 02/27/2018 01:49 PM   Modules accepted: Orders

## 2018-04-09 ENCOUNTER — Ambulatory Visit (INDEPENDENT_AMBULATORY_CARE_PROVIDER_SITE_OTHER): Payer: PRIVATE HEALTH INSURANCE | Admitting: Neurology

## 2018-04-09 ENCOUNTER — Encounter: Payer: Self-pay | Admitting: Neurology

## 2018-04-09 VITALS — BP 159/104 | HR 63 | Ht 76.0 in | Wt 232.0 lb

## 2018-04-09 DIAGNOSIS — R0681 Apnea, not elsewhere classified: Secondary | ICD-10-CM

## 2018-04-09 DIAGNOSIS — R51 Headache: Secondary | ICD-10-CM

## 2018-04-09 DIAGNOSIS — R519 Headache, unspecified: Secondary | ICD-10-CM

## 2018-04-09 DIAGNOSIS — R0683 Snoring: Secondary | ICD-10-CM | POA: Diagnosis not present

## 2018-04-09 DIAGNOSIS — E663 Overweight: Secondary | ICD-10-CM

## 2018-04-09 NOTE — Patient Instructions (Signed)

## 2018-04-09 NOTE — Progress Notes (Signed)
Subjective:    Patient ID: Joe Castro. is a 48 y.o. male.  HPI     Joe Age, MD, PhD [facility] [address], [address] [place], [place] [address]  Dear Joe Castro,   I saw your patient, Joe Castro, upon your kind request in my sleep clinic today for initial consultation of his sleep disorder, in particular, concern for underlying obstructive sleep apnea. The patient is unaccompanied today. As you know, Joe Castro is a 48 year old right-handed gentleman with an underlying medical history of recurrent headaches, history of syncope, PVCs, lumbar spine disc disease, and overweight state, who reports snoring and recurrent headaches. You prescribed Imitrex as needed. He has found it helpful, usually only takes 50 mg. He does have some nausea after taking it and takes as needed ondansetron. His daughter has migraine headaches. I reviewed your office note from 02/26/2018. His Epworth sleepiness score is 3 out of 24, fatigue score is 20 out of 63. He lives with his wife, his father lives with him as well. He works for the [employer] of [employer]. They have 2 children. He is a nonsmoker and drinks alcohol infrequently, caffeine in the form of tea and coffee. His father snores. He denies any restless leg symptoms. They have 1 dog in the household. Sometimes the dog is in their bedroom or on the bed in fact. He has 2 daughters in college, one at [employer] and the other at [employer]. His wife has noted the occasional pauses in his breathing while he is asleep. He does not wake up with a sense of gasping for air. He has a history of PVCs. He has a prior history of syncope. He is trying to lose weight. He has no night to night nocturia. Bedtime is around 9 or 9:30, rise time at 5:15 AM. Does not typically take a nap.  His Past Medical History Is Significant For: Past Medical History:  Diagnosis Date  . Migraine   . Ruptured lumbar disc    No  surgery  . Syncope    [place]/u done in  [place]    His Past Surgical History Is Significant For: Past Surgical History:  Procedure Laterality Date  . VASECTOMY  05/2013    His Family History Is Significant For: Family History  Problem Relation Castro of Onset  . Diabetes Mother   . Hypertension Mother   . Heart failure Mother   . Kidney failure Mother   . Heart disease Father        CHF  . Heart attack Father 66  . Heart failure Father   . Kidney failure Father        CKD stage 3, no dialysis   . Prostate cancer Brother 83  . Colon cancer Neg Hx   . Migraines Neg Hx   . Headache Neg Hx     His Social History Is Significant For: Social History   Socioeconomic History  . Marital status: Married    Spouse name: Not on file  . Number of children: 2  . Years of education: Not on file  . Highest education level: Bachelor's degree (e.g., BA, AB, BS)  Occupational History  . Occupation: works for the [employer]  . Financial resource strain: Not on file  . Food insecurity:    Worry: Not on file    Inability: Not on file  . Transportation needs:    Medical: Not  on file    Non-medical: Not on file  Tobacco Use  . Smoking status: Never Smoker  . Smokeless tobacco: Never Used  Substance and Sexual Activity  . Alcohol use: Yes    Comment: social  . Drug use: No  . Sexual activity: Not on file  Lifestyle  . Physical activity:    Days per week: Not on file    Minutes per session: Not on file  . Stress: Not on file  Relationships  . Social connections:    Talks on phone: Not on file    Gets together: Not on file    Attends religious service: Not on file    Active member of club or organization: Not on file    Attends meetings of clubs or organizations: Not on file    Relationship status: Not on file  Other Topics Concern  . Not on file  Social History Narrative   House hold: pt and wife and [name], LOST HIS MOTHER 04/2017. His father lives with them.    2 daughters in college    Right handed   Caffeine: daily coffee 1 cup    His Allergies Are:  Allergies  Allergen Reactions  . Hydrocodone Nausea Only    In high doses   :   His Current Medications Are:  Outpatient Encounter Medications as of 04/09/2018  Medication Sig  . Multiple Vitamin (MULTIVITAMIN) tablet Take 1 tablet by mouth daily.  . ondansetron (ZOFRAN ODT) 4 MG disintegrating tablet Take 1 tablet (4 mg total) by mouth every 8 (eight) hours as needed for nausea or vomiting.  . SUMAtriptan (IMITREX) 50 MG tablet TAKE 1 TABLET BY MOUTH WITH ONSET OF HEADACHE, MAY REPEAT IN 2 HOURS IF HEADACHES PERSISTS   No facility-administered encounter medications on file as of 04/09/2018.   :  Review of Systems:  Out of a complete 14 point review of systems, all are reviewed and negative with the exception of these symptoms as listed below:  Review of Systems  Neurological:       Pt presents today to discuss his sleep. Pt has never had a sleep study but does endorse snoring.  Epworth Sleepiness Scale 0= would never doze 1= slight chance of dozing 2= moderate chance of dozing 3= high chance of dozing  Sitting and reading: 1 Watching TV: 1 Sitting inactive in a public place (ex. Theater or meeting): 0 As a passenger in a car for an hour without a break: 0 Lying down to rest in the afternoon: 1 Sitting and talking to someone: 0 Sitting quietly after lunch (no alcohol): 0 In a car, while stopped in traffic: 0 Total: 3     Objective:  Neurological Exam  Physical Exam Physical Examination:   Vitals:   04/09/18 1534  BP: (!) 159/104  Pulse: 63    General Examination: The patient is a very pleasant 48 y.o. male in no acute distress. He appears well-developed and well-nourished and well groomed.   HEENT: Normocephalic, atraumatic, pupils are equal, round and reactive to light and accommodation. Corrective eyeglasses in place. Extraocular tracking is good without limitation to  gaze excursion or nystagmus noted. Normal smooth pursuit is noted. Hearing is grossly intact. Face is symmetric with normal facial animation and normal facial sensation. Speech is clear with no dysarthria noted. There is no hypophonia. There is no lip, neck/head, jaw or voice tremor. Neck is supple with full range of passive and active motion. There are no carotid bruits on auscultation.  Oropharynx exam reveals: mild mouth dryness, good dental hygiene and mild airway crowding, due to wider uvula. Mallampati is class I. Tongue protrudes centrally and palate elevates symmetrically. Tonsils are small. Neck size is 16-3/4 inches.  Chest: Clear to auscultation without wheezing, rhonchi or crackles noted.  Heart: S1+S2+0, regular and normal without murmurs, rubs or gallops noted.   Abdomen: Soft, non-tender and non-distended.  Extremities: There is no pitting edema in the distal lower extremities bilaterally.  Skin: Warm and dry without trophic changes noted.  Musculoskeletal: exam reveals no obvious joint deformities, tenderness or joint swelling or erythema.   Neurologically:  Mental status: The patient is awake, alert and oriented in all 4 spheres. His immediate and remote memory, attention, language skills and fund of knowledge are appropriate. There is no evidence of aphasia, agnosia, apraxia or anomia. Speech is clear with normal prosody and enunciation. Thought process is linear. Mood is normal and affect is normal.  Cranial nerves II - XII are as described above under HEENT exam. In addition: shoulder shrug is normal with equal shoulder height noted. Motor exam: Normal bulk, strength and tone is noted. There is no drift, tremor or rebound. Romberg is negative. Fine motor skills and coordination: grossly intact.  Cerebellar testing: No dysmetria or intention tremor. There is no truncal or gait ataxia.  Sensory exam: intact to light touch in the upper and lower extremities.  Gait, station and  balance: He stands easily. No veering to one side is noted. No leaning to one side is noted. Posture is Castro-appropriate and stance is narrow based. Gait shows normal stride length and normal pace. No problems turning are noted. Tandem walk is unremarkable.   Assessment and Plan:  In summary, Ryett Hamman. is a very pleasant 48 y.o.-year old male with an underlying medical history of recurrent headaches, history of syncope, PVCs, lumbar spine disc disease, and overweight state, whose history and physical exam are concerning for obstructive sleep apnea (OSA). I had a long chat with the patient about my findings and the diagnosis of OSA, its prognosis and treatment options. We talked about medical treatments, surgical interventions and non-pharmacological approaches. I explained in particular the risks and ramifications of untreated moderate to severe OSA, especially with respect to developing cardiovascular disease down the Road, including congestive heart failure, difficult to treat hypertension, cardiac arrhythmias, or stroke. Even type 2 diabetes has, in part, been linked to untreated OSA. Symptoms of untreated OSA include daytime sleepiness, memory problems, mood irritability and mood disorder such as depression and anxiety, lack of energy, as well as recurrent headaches, especially morning headaches. We talked about trying to maintain a healthy lifestyle in general, as well as the importance of weight control. I encouraged the patient to eat healthy, exercise daily and keep well hydrated, to keep a scheduled bedtime and wake time routine, to not skip any meals and eat healthy snacks in between meals. I advised the patient not to drive when feeling sleepy. I recommended the following at this time: sleep study with potential positive airway pressure titration. (We will score hypopneas at 3%).   I explained the sleep test procedure to the patient and explained the CPAP therapy to him. He would be  willing to try CPAP her AutoPap if the need arises.  I answered all his questions today and the patient was in agreement. I plan to see him back after the sleep study is completed and encouraged him to call with any interim questions, concerns, problems  or updates.   Thank you very much for allowing me to participate in the care of this nice patient. If I can be of any further assistance to you please do not hesitate to talk to me.  Sincerely,   Joe Age, MD, PhD  Right and 1 on the

## 2018-07-11 ENCOUNTER — Telehealth: Payer: Self-pay

## 2018-07-11 NOTE — Telephone Encounter (Signed)
We have attempted to call the patient two times to schedule sleep study.  Patient has been unavailable at the phone numbers we have on file and has not returned our calls.  At this point we will send a letter asking patient to please contact the sleep lab to schedule their sleep study.  If patient calls back we will schedule them for their sleep study. 

## 2018-10-13 ENCOUNTER — Other Ambulatory Visit: Payer: Self-pay | Admitting: Internal Medicine

## 2018-10-29 ENCOUNTER — Encounter: Payer: Self-pay | Admitting: Internal Medicine

## 2018-11-20 ENCOUNTER — Other Ambulatory Visit: Payer: Self-pay

## 2018-11-20 DIAGNOSIS — Z20822 Contact with and (suspected) exposure to covid-19: Secondary | ICD-10-CM

## 2018-11-21 ENCOUNTER — Encounter: Payer: Self-pay | Admitting: Internal Medicine

## 2018-11-22 ENCOUNTER — Telehealth: Payer: PRIVATE HEALTH INSURANCE | Admitting: Physician Assistant

## 2018-11-22 DIAGNOSIS — M542 Cervicalgia: Secondary | ICD-10-CM

## 2018-11-22 DIAGNOSIS — R519 Headache, unspecified: Secondary | ICD-10-CM

## 2018-11-22 LAB — NOVEL CORONAVIRUS, NAA: SARS-CoV-2, NAA: NOT DETECTED

## 2018-11-22 NOTE — Progress Notes (Signed)
Based on what you shared with me, I feel your condition warrants further evaluation and I recommend that you be seen for a face to face visit.  Please contact your primary care physician practice to be seen. Many offices offer virtual options to be seen via video if you are not comfortable going in person to a medical facility at this time.  If you do not have a PCP, [facility] offers a free physician referral service available at [phone]. Our trained staff has the experience, knowledge and resources to put you in touch with a physician who is right for you.   You also have the option of a video visit through [email or web address]  If you are having a true medical emergency please call 911.  NOTE: If you entered your credit card information for this eVisit, you will not be charged. You may see a "hold" on your card for the $35 but that hold will drop off and you will not have a charge processed.  Your e-visit answers were reviewed by a board certified advanced clinical practitioner to complete your personal care plan.  Thank you for using e-Visits.   Having the headache for a chronic amount of time and the neck pain can indicate some other problem is going on.  And we recommend that you get to your PCP or in urgent care or in emergency department to have this further evaluated with testing.  Particia Nearing

## 2018-11-23 ENCOUNTER — Encounter: Payer: Self-pay | Admitting: Internal Medicine

## 2018-11-23 ENCOUNTER — Ambulatory Visit (HOSPITAL_BASED_OUTPATIENT_CLINIC_OR_DEPARTMENT_OTHER)
Admission: RE | Admit: 2018-11-23 | Discharge: 2018-11-23 | Disposition: A | Discharge status: Home / Self Care | Payer: PRIVATE HEALTH INSURANCE | Source: Ambulatory Visit | Attending: Internal Medicine | Admitting: Internal Medicine

## 2018-11-23 ENCOUNTER — Other Ambulatory Visit: Payer: Self-pay

## 2018-11-23 ENCOUNTER — Ambulatory Visit (INDEPENDENT_AMBULATORY_CARE_PROVIDER_SITE_OTHER): Payer: PRIVATE HEALTH INSURANCE | Admitting: Internal Medicine

## 2018-11-23 VITALS — BP 135/97 | HR 91 | Temp 97.5°F | Resp 16 | Ht 76.0 in | Wt 235.2 lb

## 2018-11-23 DIAGNOSIS — G4452 New daily persistent headache (NDPH): Secondary | ICD-10-CM | POA: Insufficient documentation

## 2018-11-23 MED ORDER — CYCLOBENZAPRINE HCL 10 MG PO TABS: 10.0000 mg | ORAL_TABLET | Freq: Every evening | ORAL | 0 refills | Status: DC | PRN

## 2018-11-23 NOTE — Progress Notes (Signed)
Pre visit review using our clinic review tool, if applicable. No additional management support is needed unless otherwise documented below in the visit note. 

## 2018-11-23 NOTE — Progress Notes (Signed)
Subjective:    Patient ID: Joe Can., male    DOB: [birth year], 48 y.o.   MRN: [id number]  DOS:  11/23/2018 Type of visit - description: Acute  Symptoms started 11/18/2018 with gradual onset of headache, located at the nuchal area, it was persistent for at least 3 days, steady, associated with some nausea, different to his normal migraines.  Shortly after, he developed bilateral, posterior neck stiffness and discomfort.  At this point a headache is essentially gone but he has some persistent neck discomfort.  He also had fatigue and that seems to be improved, nausea decreased.  BP Readings from Last 3 Encounters:  11/23/18 (!) 135/97  04/09/18 (!) 159/104  02/26/18 (!) 138/92     Review of Systems Denies fever chills. No vomiting No rash No head injury No upper or lower extremity paresthesias No diplopia, slurred speech or motor deficits. On 11/20/2018 he experienced some off-balance feelings, lasted for few hours.  The off-balance was both at rest and with motion.   Past Medical History:  Diagnosis Date  . Migraine   . Ruptured lumbar disc    No surgery  . Syncope    [place] in  [facility]    Past Surgical History:  Procedure Laterality Date  . VASECTOMY  05/2013    Social History   Socioeconomic History  . Marital status: Married    Spouse name: Not on file  . Number of children: 2  . Years of education: Not on file  . Highest education level: Bachelor's degree (e.g., BA, AB, BS)  Occupational History  . Occupation: works for the [employer]  . Financial resource strain: Not on file  . Food insecurity    Worry: Not on file    Inability: Not on file  . Transportation needs    Medical: Not on file    Non-medical: Not on file  Tobacco Use  . Smoking status: Never Smoker  . Smokeless tobacco: Never Used  Substance and Sexual Activity  . Alcohol use: Yes    Comment: social  . Drug use: No  . Sexual activity: Not on file   Lifestyle  . Physical activity    Days per week: Not on file    Minutes per session: Not on file  . Stress: Not on file  Relationships  . Social Herbalist on phone: Not on file    Gets together: Not on file    Attends religious service: Not on file    Active member of club or organization: Not on file    Attends meetings of clubs or organizations: Not on file    Relationship status: Not on file  . Intimate partner violence    Fear of current or ex partner: Not on file    Emotionally abused: Not on file    Physically abused: Not on file    Forced sexual activity: Not on file  Other Topics Concern  . Not on file  Social History Narrative   House hold: pt and wife and [name], LOST HIS MOTHER 04/2017. His father lives with them.   2 daughters in college    Right handed   Caffeine: daily coffee 1 cup      Allergies as of 11/23/2018      Reactions   Hydrocodone Nausea Only   In high doses    Nabumetone    Nausea, dizziness as described by the patient  Medication List       Accurate as of November 23, 2018 11:59 PM. If you have any questions, ask your nurse or doctor.        cyclobenzaprine 10 MG tablet Commonly known as: FLEXERIL Take 1 tablet (10 mg total) by mouth at bedtime as needed for muscle spasms. Started by: Kathlene November, MD   multivitamin tablet Take 1 tablet by mouth daily.   ondansetron 4 MG disintegrating tablet Commonly known as: Zofran ODT Take 1 tablet (4 mg total) by mouth every 8 (eight) hours as needed for nausea or vomiting.   SUMAtriptan 50 MG tablet Commonly known as: IMITREX TAKE 1 TABLET BY MOUTH WITH ONSET OF HEADACHE, MAY REPEAT IN 2 HOURS IF HEADACHE PERSIST           Objective:   Physical Exam BP (!) 135/97 (BP Location: Left Arm, Patient Position: Sitting, Cuff Size: Normal)   Pulse 91   Temp (!) 97.5 F (36.4 C) (Temporal)   Resp 16   Ht 6\' 4"  (1.93 m)   Wt 235 lb 4 oz (106.7 kg)   SpO2 97%   BMI 28.64 kg/m   General:   Well developed, NAD, BMI noted. HEENT:  Normocephalic . Face symmetric, atraumatic Neck: No TTP at the cervical spine and range of motion normal. Lungs:  CTA B Normal respiratory effort, no intercostal retractions, no accessory muscle use. Heart: RRR,  no murmur.  No pretibial edema bilaterally  Skin: Not pale. Not jaundice Neurologic:  alert & oriented X3.  Speech normal, gait appropriate for age and unassisted Motor and DTRs symmetric, EOMI. Psych--  Cognition and judgment appear intact.  Cooperative with normal attention span and concentration.  Behavior appropriate. No anxious or depressed appearing.      Assessment     Assessment H/o migraines H/o syncope, saw cards  ++ PVCs per holter 2015  PLAN  Headache: As described above, now essentially gone but have residual neck pain/stiffness, had some nausea and fatigue. DDx includes atypical migraine, tension headache, but also an ICB giving the neck discomfort. Plan: Stat CT head.  If negative will proceed with observation, Flexeril as needed, Tylenol and fluids. If he has severe headache, recommend ER even if CT negative, call if he has persistent mild symptoms. Elevated BP: Recommend to monitor at home.

## 2018-11-23 NOTE — Patient Instructions (Signed)
   STOP BY THE FIRST FLOOR:  get the CAT scan of your head   Rest, fluids, take Flexeril, a muscle relaxant at night.  Please call if you continue to having headaches or the neck pain is persistent  If severe headache: Go to the ER   Check the  blood pressure 2 or 3 times a week BP GOAL is between 110/65 and  135/85. If it is consistently higher or lower, let me know

## 2018-11-25 NOTE — Assessment & Plan Note (Signed)
Headache: As described above, now essentially gone but have residual neck pain/stiffness, had some nausea and fatigue. DDx includes atypical migraine, tension headache, but also an ICB giving the neck discomfort. Plan: Stat CT head.  If negative will proceed with observation, Flexeril as needed, Tylenol and fluids. If he has severe headache, recommend ER even if CT negative, call if he has persistent mild symptoms. Elevated BP: Recommend to monitor at home.

## 2018-12-26 ENCOUNTER — Ambulatory Visit (INDEPENDENT_AMBULATORY_CARE_PROVIDER_SITE_OTHER): Payer: PRIVATE HEALTH INSURANCE | Admitting: Internal Medicine

## 2018-12-26 ENCOUNTER — Other Ambulatory Visit: Payer: Self-pay

## 2018-12-26 ENCOUNTER — Encounter: Payer: Self-pay | Admitting: Internal Medicine

## 2018-12-26 VITALS — BP 133/100 | HR 76 | Temp 96.6°F | Resp 16 | Ht 76.0 in | Wt 229.5 lb

## 2018-12-26 DIAGNOSIS — Z0000 Encounter for general adult medical examination without abnormal findings: Secondary | ICD-10-CM

## 2018-12-26 DIAGNOSIS — Z1211 Encounter for screening for malignant neoplasm of colon: Secondary | ICD-10-CM

## 2018-12-26 DIAGNOSIS — R030 Elevated blood-pressure reading, without diagnosis of hypertension: Secondary | ICD-10-CM

## 2018-12-26 LAB — PSA: PSA: 1.81 ng/mL (ref 0.10–4.00)

## 2018-12-26 LAB — CBC WITH DIFFERENTIAL/PLATELET
Basophils Absolute: 0 10*3/uL (ref 0.0–0.1)
Basophils Relative: 0.9 % (ref 0.0–3.0)
Eosinophils Absolute: 0.2 10*3/uL (ref 0.0–0.7)
Eosinophils Relative: 4 % (ref 0.0–5.0)
HCT: 46.4 % (ref 39.0–52.0)
Hemoglobin: 15.6 g/dL (ref 13.0–17.0)
Lymphocytes Relative: 29.8 % (ref 12.0–46.0)
Lymphs Abs: 1.1 10*3/uL (ref 0.7–4.0)
MCHC: 33.5 g/dL (ref 30.0–36.0)
MCV: 88.9 fl (ref 78.0–100.0)
Monocytes Absolute: 0.3 10*3/uL (ref 0.1–1.0)
Monocytes Relative: 7.9 % (ref 3.0–12.0)
Neutro Abs: 2.2 10*3/uL (ref 1.4–7.7)
Neutrophils Relative %: 57.4 % (ref 43.0–77.0)
Platelets: 289 10*3/uL (ref 150.0–400.0)
RBC: 5.22 Mil/uL (ref 4.22–5.81)
RDW: 13.1 % (ref 11.5–15.5)
WBC: 3.8 10*3/uL — ABNORMAL LOW (ref 4.0–10.5)

## 2018-12-26 LAB — COMPREHENSIVE METABOLIC PANEL
ALT: 23 U/L (ref 0–53)
AST: 21 U/L (ref 0–37)
Albumin: 4.6 g/dL (ref 3.5–5.2)
Alkaline Phosphatase: 53 U/L (ref 39–117)
BUN: 15 mg/dL (ref 6–23)
CO2: 32 mEq/L (ref 19–32)
Calcium: 9.7 mg/dL (ref 8.4–10.5)
Chloride: 103 mEq/L (ref 96–112)
Creatinine, Ser: 1.24 mg/dL (ref 0.40–1.50)
GFR: 61.53 mL/min (ref >60.00)
Glucose, Bld: 96 mg/dL (ref 70–99)
Potassium: 4.4 mEq/L (ref 3.5–5.1)
Sodium: 141 mEq/L (ref 135–145)
Total Bilirubin: 0.8 mg/dL (ref 0.2–1.2)
Total Protein: 7.2 g/dL (ref 6.0–8.3)

## 2018-12-26 LAB — LIPID PANEL
Cholesterol: 190 mg/dL (ref 0–200)
HDL: 40.8 mg/dL (ref >39.00)
LDL Cholesterol: 125 mg/dL — ABNORMAL HIGH (ref 0–99)
NonHDL: 149.23
Total CHOL/HDL Ratio: 5
Triglycerides: 123 mg/dL (ref 0.0–149.0)
VLDL: 24.6 mg/dL (ref 0.0–40.0)

## 2018-12-26 MED ORDER — FLUTICASONE PROPIONATE 50 MCG/ACT NA SUSP: 2.0000 | Freq: Every day | NASAL | 5 refills | Status: DC | PRN

## 2018-12-26 NOTE — Progress Notes (Signed)
Subjective:    Patient ID: Joe Can., male    DOB: 1970-05-07, 48 y.o.   MRN: [id number]  DOS:  12/26/2018 Type of visit - description: CPX Since the last office visit, headache essentially gone, still from time to time has a mild headache at the right side of the nuchal area and neck that responds to Flexeril.  BP Readings from Last 3 Encounters:  12/26/18 (!) 133/100  11/23/18 (!) 135/97  04/09/18 (!) 159/104    Review of Systems   other than above, a 14 point review of systems is negative     Past Medical History:  Diagnosis Date  . Migraine   . Ruptured lumbar disc    No surgery  . Syncope    [place]/u done in  [facility]    Past Surgical History:  Procedure Laterality Date  . VASECTOMY  05/2013    Social History   Socioeconomic History  . Marital status: Married    Spouse name: Not on file  . Number of children: 2  . Years of education: Not on file  . Highest education level: Bachelor's degree (e.g., BA, AB, BS)  Occupational History  . Occupation: works for the [employer]  . Financial resource strain: Not on file  . Food insecurity    Worry: Not on file    Inability: Not on file  . Transportation needs    Medical: Not on file    Non-medical: Not on file  Tobacco Use  . Smoking status: Never Smoker  . Smokeless tobacco: Never Used  Substance and Sexual Activity  . Alcohol use: Yes    Comment: social  . Drug use: No  . Sexual activity: Not on file  Lifestyle  . Physical activity    Days per week: Not on file    Minutes per session: Not on file  . Stress: Not on file  Relationships  . Social Herbalist on phone: Not on file    Gets together: Not on file    Attends religious service: Not on file    Active member of club or organization: Not on file    Attends meetings of clubs or organizations: Not on file    Relationship status: Not on file  . Intimate partner violence    Fear of current or ex partner: Not on  file    Emotionally abused: Not on file    Physically abused: Not on file    Forced sexual activity: Not on file  Other Topics Concern  . Not on file  Social History Narrative   House hold: pt and wife and FATHER   LOST HIS MOTHER 04/2017. His father lives with them.   1 daughter graduated, lives w/ parents, 1 daughter in college    Right handed   Caffeine: daily coffee 1 cup     Family History  Problem Relation Age of Onset  . Diabetes Mother   . Hypertension Mother   . Heart failure Mother   . Kidney failure Mother   . Heart disease Father        CHF  . Heart attack Father 39  . Heart failure Father   . Kidney failure Father        CKD stage 3, no dialysis   . Prostate cancer Brother 37  . Colon cancer Neg Hx   . Migraines Neg Hx   . Headache Neg Hx  Allergies as of 12/26/2018      Reactions   Hydrocodone Nausea Only   In high doses    Nabumetone    Nausea, dizziness as described by the patient      Medication List       Accurate as of December 26, 2018 11:59 PM. If you have any questions, ask your nurse or doctor.        cyclobenzaprine 10 MG tablet Commonly known as: FLEXERIL Take 1 tablet (10 mg total) by mouth at bedtime as needed for muscle spasms.   fluticasone 50 MCG/ACT nasal spray Commonly known as: FLONASE Place 2 sprays into both nostrils daily as needed for allergies or rhinitis.   multivitamin tablet Take 1 tablet by mouth daily.   ondansetron 4 MG disintegrating tablet Commonly known as: Zofran ODT Take 1 tablet (4 mg total) by mouth every 8 (eight) hours as needed for nausea or vomiting.   SUMAtriptan 50 MG tablet Commonly known as: IMITREX TAKE 1 TABLET BY MOUTH WITH ONSET OF HEADACHE, MAY REPEAT IN 2 HOURS IF HEADACHE PERSIST           Objective:   Physical Exam BP (!) 133/100 (BP Location: Left Arm, Patient Position: Sitting, Cuff Size: Normal)   Pulse 76   Temp (!) 96.6 F (35.9 C) (Temporal)   Resp 16   Ht 6\' 4"   (1.93 m)   Wt 229 lb 8 oz (104.1 kg)   SpO2 100%   BMI 27.94 kg/m  General: Well developed, NAD, BMI noted Neck: No  thyromegaly  HEENT:  Normocephalic . Face symmetric, atraumatic Lungs:  CTA B Normal respiratory effort, no intercostal retractions, no accessory muscle use. Heart: RRR,  no murmur.  No pretibial edema bilaterally  Abdomen:  Not distended, soft, non-tender. No rebound or rigidity.   Skin: Exposed areas without rash. Not pale. Not jaundice DRE: Normal sphincter tone, brown stools, normal prostate Neurologic:  alert & oriented X3.  Speech normal, gait appropriate for age and unassisted Strength symmetric and appropriate for age.  Psych: Cognition and judgment appear intact.  Cooperative with normal attention span and concentration.  Behavior appropriate. No anxious or depressed appearing.     Assessment     Assessment H/o migraines H/o syncope, saw cards  ++ PVCs per holter 2015  PLAN  Here for CPX Headache: See last visit, CT negative, still has occasional pain mostly at the right side of the neck and nuchal area.  Headache might have been cervicogenic.  For now recommend to monitor symptoms, call if has a severe or different headache take Flexeril as needed Elevated BP: BPs at home consistently 120/80, never more than 141/80.  Observation. EKG today: NSR, single ectopic ventricular beat noted RTC 3 months mostly to check BPs

## 2018-12-26 NOTE — Progress Notes (Signed)
Pre visit review using our clinic review tool, if applicable. No additional management support is needed unless otherwise documented below in the visit note. 

## 2018-12-26 NOTE — Patient Instructions (Addendum)
GO TO THE LAB : Get the blood work     GO TO THE FRONT DESK Schedule your next appointment   for a checkup in 6 months   Check the  blood pressure weekly BP GOAL is between 110/65 and  135/85. If it is consistently higher or lower, let me know   

## 2018-12-26 NOTE — Assessment & Plan Note (Addendum)
-  Tdap 2018,s/p Flu shot  - CCS: Never had a cscope.  3 modalities discussed, elected colonoscopy, referral sent -+FH Prostate ca: DRE wnl, check a PSA -FH CAD - pt is asx  -Labs: CMP, FLP, CBC, PSA -diet,exercise discussed

## 2018-12-27 NOTE — Assessment & Plan Note (Signed)
Here for CPX Headache: See last visit, CT negative, still has occasional pain mostly at the right side of the neck and nuchal area.  Headache might have been cervicogenic.  For now recommend to monitor symptoms, call if has a severe or different headache take Flexeril as needed Elevated BP: BPs at home consistently 120/80, never more than 141/80.  Observation. EKG today: NSR, single ectopic ventricular beat noted RTC 3 months mostly to check BPs

## 2018-12-28 ENCOUNTER — Encounter: Payer: Self-pay | Admitting: Gastroenterology

## 2019-01-22 ENCOUNTER — Encounter: Payer: Self-pay | Admitting: Gastroenterology

## 2019-01-22 ENCOUNTER — Ambulatory Visit (AMBULATORY_SURGERY_CENTER): Payer: PRIVATE HEALTH INSURANCE | Admitting: *Deleted

## 2019-01-22 ENCOUNTER — Other Ambulatory Visit: Payer: Self-pay

## 2019-01-22 VITALS — Temp 97.1°F | Ht 76.0 in | Wt 236.0 lb

## 2019-01-22 DIAGNOSIS — Z1159 Encounter for screening for other viral diseases: Secondary | ICD-10-CM

## 2019-01-22 DIAGNOSIS — Z1211 Encounter for screening for malignant neoplasm of colon: Secondary | ICD-10-CM

## 2019-01-22 MED ORDER — NA SULFATE-K SULFATE-MG SULF 17.5-3.13-1.6 GM/177ML PO SOLN: ORAL | 0 refills | Status: DC

## 2019-01-22 NOTE — Progress Notes (Signed)
Patient is here in-person for PV. Patient denies any allergies to eggs or soy. Patient denies any problems with anesthesia/sedation. Patient denies any oxygen use at home. Patient denies taking any diet/weight loss medications or blood thinners. Patient is not being treated for MRSA or C-diff. EMMI education assisgned to the patient for the procedure, this was explained and instructions given to patient. COVID-19 screening test is on 12/23, the pt is aware. Pt is aware that care partner will wait in the car during procedure; if they feel like they will be too hot or cold to wait in the car; they may wait in the 4 th floor lobby. Patient is aware to bring only one care partner. We want them to wear a mask (we do not have any that we can provide them), practice social distancing, and we will check their temperatures when they get here.  I did remind the patient that their care partner needs to stay in the parking lot the entire time and have a cell phone available, we will call them when the pt is ready for discharge. Patient will wear mask into building.    Suprep $15 off coupon given to the patient.

## 2019-02-05 ENCOUNTER — Encounter: Payer: PRIVATE HEALTH INSURANCE | Admitting: Gastroenterology

## 2019-02-22 ENCOUNTER — Other Ambulatory Visit: Payer: Self-pay | Admitting: Gastroenterology

## 2019-02-22 ENCOUNTER — Ambulatory Visit (INDEPENDENT_AMBULATORY_CARE_PROVIDER_SITE_OTHER): Payer: PRIVATE HEALTH INSURANCE

## 2019-02-22 DIAGNOSIS — Z1159 Encounter for screening for other viral diseases: Secondary | ICD-10-CM

## 2019-02-22 LAB — SARS CORONAVIRUS 2 (TAT 6-24 HRS): SARS Coronavirus 2: NEGATIVE

## 2019-02-26 ENCOUNTER — Ambulatory Visit (AMBULATORY_SURGERY_CENTER): Payer: PRIVATE HEALTH INSURANCE | Admitting: Gastroenterology

## 2019-02-26 ENCOUNTER — Encounter: Payer: Self-pay | Admitting: Gastroenterology

## 2019-02-26 ENCOUNTER — Other Ambulatory Visit: Payer: Self-pay

## 2019-02-26 VITALS — BP 120/72 | HR 77 | Temp 98.5°F | Resp 19 | Ht 76.0 in | Wt 236.0 lb

## 2019-02-26 DIAGNOSIS — D124 Benign neoplasm of descending colon: Secondary | ICD-10-CM | POA: Diagnosis not present

## 2019-02-26 DIAGNOSIS — D125 Benign neoplasm of sigmoid colon: Secondary | ICD-10-CM | POA: Diagnosis not present

## 2019-02-26 DIAGNOSIS — K640 First degree hemorrhoids: Secondary | ICD-10-CM

## 2019-02-26 DIAGNOSIS — K5730 Diverticulosis of large intestine without perforation or abscess without bleeding: Secondary | ICD-10-CM

## 2019-02-26 DIAGNOSIS — K635 Polyp of colon: Secondary | ICD-10-CM | POA: Diagnosis not present

## 2019-02-26 DIAGNOSIS — Z1211 Encounter for screening for malignant neoplasm of colon: Secondary | ICD-10-CM

## 2019-02-26 MED ORDER — SODIUM CHLORIDE 0.9 % IV SOLN: 500.0000 mL | Freq: Once | INTRAVENOUS | Status: DC

## 2019-02-26 NOTE — Progress Notes (Signed)
Called to room to assist during endoscopic procedure.  Patient ID and intended procedure confirmed with present staff. Received instructions for my participation in the procedure from the performing physician.  

## 2019-02-26 NOTE — Op Note (Signed)
[facility] Patient Name: Joe Castro Procedure Date: 02/26/2019 1:34 PM MRN: [id number] Endoscopist: Gerrit Heck , MD Age: 49 Referring MD:  Date of Birth: Mar 11, 1970 Gender: Male Account #: [id number] Procedure:                Colonoscopy Indications:              Screening for colorectal malignant neoplasm, This                            is the patient's first colonoscopy Medicines:                Monitored Anesthesia Care Procedure:                Pre-Anesthesia Assessment:                           - Prior to the procedure, a History and Physical                            was performed, and patient medications and                            allergies were reviewed. The patient's tolerance of                            previous anesthesia was also reviewed. The risks                            and benefits of the procedure and the sedation                            options and risks were discussed with the patient.                            All questions were answered, and informed consent                            was obtained. Prior Anticoagulants: The patient has                            taken no previous anticoagulant or antiplatelet                            agents. ASA Grade Assessment: II - A patient with                            mild systemic disease. After reviewing the risks                            and benefits, the patient was deemed in                            satisfactory condition to undergo the procedure.  After obtaining informed consent, the colonoscope                            was passed under direct vision. Throughout the                            procedure, the patient's blood pressure, pulse, and                            oxygen saturations were monitored continuously. The                            Colonoscope was introduced through the anus and                            advanced to the the cecum,  identified by                            appendiceal orifice and ileocecal valve. The                            ileocecal valve, appendiceal orifice, and rectum                            were photographed. The colonoscopy was performed                            without difficulty. The patient tolerated the                            procedure well. The quality of the bowel                            preparation was excellent. The ileocecal valve,                            appendiceal orifice, and rectum were photographed. Scope In: 1:37:53 PM Scope Out: 1:56:15 PM Scope Withdrawal Time: 0 hours 15 minutes 27 seconds  Total Procedure Duration: 0 hours 18 minutes 22 seconds  Findings:                 The perianal and digital rectal examinations were                            normal.                           Three sessile polyps were found in the sigmoid                            colon (2) and descending colon (1). The polyps were                            2 to 7 mm in size. These polyps were removed with a  cold snare. Resection and retrieval were complete.                            Estimated blood loss was minimal.                           A single small-mouthed diverticulum was found in                            the sigmoid colon.                           Non-bleeding internal hemorrhoids were found during                            retroflexion. The hemorrhoids were small. Complications:            No immediate complications. Estimated Blood Loss:     Estimated blood loss was minimal. Impression:               - Three 2 to 7 mm polyps in the sigmoid colon and                            in the descending colon, removed with a cold snare.                            Resected and retrieved.                           - Diverticulosis in the sigmoid colon.                           - Non-bleeding internal hemorrhoids. Recommendation:           - Patient  has a contact number available for                            emergencies. The signs and symptoms of potential                            delayed complications were discussed with the                            patient. Return to normal activities tomorrow.                            Written discharge instructions were provided to the                            patient.                           - Resume previous diet.                           - Continue present medications.                           -  Await pathology results.                           - Repeat colonoscopy for surveillance based on                            pathology results.                           - Return to GI clinic PRN.                           - Use fiber, for example Citrucel, Fibercon, Konsyl                            or Metamucil.                           - Internal hemorrhoids were noted on this study and                            may be amenable to hemorrhoid band ligation. If you                            are interested in further treatment of these                            hemorrhoids with band ligation, please contact my                            clinic to set up an appointment for evaluation and                            treatment. Gerrit Heck, MD 02/26/2019 2:00:28 PM

## 2019-02-26 NOTE — Progress Notes (Signed)
To PACU VSS. Report to [email or web address] 

## 2019-02-26 NOTE — Patient Instructions (Signed)
Discharge instructions given. Handouts on polyps,diverticulosis and hemorrhoids. Resume previous medications. YOU HAD AN ENDOSCOPIC PROCEDURE TODAY AT THE [facility]:   Refer to the procedure report that was given to you for any specific questions about what was found during the examination.  If the procedure report does not answer your questions, please call your gastroenterologist to clarify.  If you requested that your care partner not be given the details of your procedure findings, then the procedure report has been included in a sealed envelope for you to review at your convenience later.  YOU SHOULD EXPECT: Some feelings of bloating in the abdomen. Passage of more gas than usual.  Walking can help get rid of the air that was put into your GI tract during the procedure and reduce the bloating. If you had a lower endoscopy (such as a colonoscopy or flexible sigmoidoscopy) you may notice spotting of blood in your stool or on the toilet paper. If you underwent a bowel prep for your procedure, you may not have a normal bowel movement for a few days.  Please Note:  You might notice some irritation and congestion in your nose or some drainage.  This is from the oxygen used during your procedure.  There is no need for concern and it should clear up in a day or so.  SYMPTOMS TO REPORT IMMEDIATELY:   Following lower endoscopy (colonoscopy or flexible sigmoidoscopy):  Excessive amounts of blood in the stool  Significant tenderness or worsening of abdominal pains  Swelling of the abdomen that is new, acute  Fever of 100F or higher  For urgent or emergent issues, a gastroenterologist can be reached at any hour by calling [phone].   DIET:  We do recommend a small meal at first, but then you may proceed to your regular diet.  Drink plenty of fluids but you should avoid alcoholic beverages for 24 hours.  ACTIVITY:  You should plan to take it easy for the rest of today and you  should NOT DRIVE or use heavy machinery until tomorrow (because of the sedation medicines used during the test).    FOLLOW UP: Our staff will call the number listed on your records 48-72 hours following your procedure to check on you and address any questions or concerns that you may have regarding the information given to you following your procedure. If we do not reach you, we will leave a message.  We will attempt to reach you two times.  During this call, we will ask if you have developed any symptoms of COVID 19. If you develop any symptoms (ie: fever, flu-like symptoms, shortness of breath, cough etc.) before then, please call [phone].  If you test positive for Covid 19 in the 2 weeks post procedure, please call and report this information to us.    If any biopsies were taken you will be contacted by phone or by letter within the next 1-3 weeks.  Please call us at [phone] if you have not heard about the biopsies in 3 weeks.    SIGNATURES/CONFIDENTIALITY: You and/or your care partner have signed paperwork which will be entered into your electronic medical record.  These signatures attest to the fact that that the information above on your After Visit Summary has been reviewed and is understood.  Full responsibility of the confidentiality of this discharge information lies with you and/or your care-partner. 

## 2019-02-28 ENCOUNTER — Telehealth: Payer: Self-pay | Admitting: *Deleted

## 2019-02-28 NOTE — Telephone Encounter (Signed)
Left message on f/u call 

## 2019-02-28 NOTE — Telephone Encounter (Signed)
Message left

## 2019-03-06 ENCOUNTER — Encounter: Payer: Self-pay | Admitting: Gastroenterology

## 2019-04-13 ENCOUNTER — Ambulatory Visit: Payer: PRIVATE HEALTH INSURANCE | Attending: Internal Medicine

## 2019-04-13 DIAGNOSIS — Z23 Encounter for immunization: Secondary | ICD-10-CM

## 2019-04-13 NOTE — Progress Notes (Signed)
   [id number]  Name:  Joe Castro.    MRN: [id number] DOB: [birth year]  04/13/2019  Joe Castro was observed post Covid-19 immunization for 15 minutes without incident. He was provided with Vaccine Information Sheet and instruction to access the V-Safe system.   Joe Castro was instructed to call 911 with any severe reactions post vaccine: Marland Kitchen Difficulty breathing  . Swelling of face and throat  . A fast heartbeat  . A bad rash all over body  . Dizziness and weakness   Immunizations Administered    Name Date Dose VIS Date Route   Pfizer COVID-19 Vaccine 04/13/2019 10:57 AM 0.3 mL 01/18/2019 Intramuscular   Manufacturer: [place]   Lot: [id number]   [place]: [id number]

## 2019-05-04 ENCOUNTER — Ambulatory Visit: Payer: PRIVATE HEALTH INSURANCE | Attending: Internal Medicine

## 2019-05-04 DIAGNOSIS — Z23 Encounter for immunization: Secondary | ICD-10-CM

## 2019-05-04 NOTE — Progress Notes (Signed)
   [id number]  Name:  Zubeyr Perillo.    MRN: [id number] DOB: 07-22-1970  05/04/2019  Mr. Westmeyer was observed post Covid-19 immunization for 15 minutes without incident. He was provided with Vaccine Information Sheet and instruction to access the V-Safe system.   Mr. Kleinberg was instructed to call 911 with any severe reactions post vaccine: Marland Kitchen Difficulty breathing  . Swelling of face and throat  . A fast heartbeat  . A bad rash all over body  . Dizziness and weakness   Immunizations Administered    Name Date Dose VIS Date Route   Pfizer COVID-19 Vaccine 05/04/2019 11:58 AM 0.3 mL 01/18/2019 Intramuscular   Manufacturer: [place]   Lot: [id number]   [place]: [id number]

## 2019-05-06 ENCOUNTER — Ambulatory Visit (INDEPENDENT_AMBULATORY_CARE_PROVIDER_SITE_OTHER)
Admission: RE | Admit: 2019-05-06 | Discharge: 2019-05-06 | Disposition: A | Discharge status: Home / Self Care | Payer: 59 | Source: Ambulatory Visit

## 2019-05-06 DIAGNOSIS — R21 Rash and other nonspecific skin eruption: Secondary | ICD-10-CM | POA: Diagnosis not present

## 2019-05-06 DIAGNOSIS — R220 Localized swelling, mass and lump, head: Secondary | ICD-10-CM

## 2019-05-06 MED ORDER — HYDROCORTISONE 1 % EX CREA: TOPICAL_CREAM | CUTANEOUS | 0 refills | Status: DC

## 2019-05-06 NOTE — ED Provider Notes (Signed)
Virtual Visit via Video Note:  Joe Castro.  initiated request for Telemedicine visit with [facility] team. I connected with Joe Castro.  on 05/06/2019 at 11:27 AM  for a synchronized telemedicine visit using a video enabled HIPPA compliant telemedicine application. I verified that I am speaking with Joe Castro.  using two identifiers. Joe Eagles, PA-C  was physically located in a [facility] Urgent care site and Kapil Sarra. was located at a different location.   The limitations of evaluation and management by telemedicine as well as the availability of in-person appointments were discussed. Patient was informed that he  may incur a bill ( including co-pay) for this virtual visit encounter. [facility]  expressed understanding and gave verbal consent to proceed with virtual visit.     History of Present Illness:Joe Castro.  is a 49 y.o. male presents with 3-day history of persistent left-sided nose swelling that extends across under his left lower eyelid.  Symptoms started hours after he got his second Covid vaccine on Saturday.  Denies any particular fever, pain, vision change, eye pain, nose pain, oral swelling, lesions of the mouth, shortness of breath, nausea, vomiting, belly pain.  Patient states that the symptoms wax and wane and tend to be more prominent in the morning.  Denies any inciting factors apart from the Covid vaccine including insect bites or stings.  ROS  No current facility-administered medications for this encounter.   Current Outpatient Medications  Medication Sig Dispense Refill  . cyclobenzaprine (FLEXERIL) 10 MG tablet Take 1 tablet (10 mg total) by mouth at bedtime as needed for muscle spasms. (Patient not taking: Reported on 12/26/2018) 21 tablet 0  . fluticasone (FLONASE) 50 MCG/ACT nasal spray Place 2 sprays into both nostrils daily as needed for allergies or rhinitis. (Patient not taking: Reported on 01/22/2019)  16 g 5  . Multiple Vitamin (MULTIVITAMIN) tablet Take 1 tablet by mouth daily.    . ondansetron (ZOFRAN ODT) 4 MG disintegrating tablet Take 1 tablet (4 mg total) by mouth every 8 (eight) hours as needed for nausea or vomiting. (Patient not taking: Reported on 02/26/2019) 40 tablet 0  . SUMAtriptan (IMITREX) 50 MG tablet TAKE 1 TABLET BY MOUTH WITH ONSET OF HEADACHE, MAY REPEAT IN 2 HOURS IF HEADACHE PERSIST (Patient not taking: Reported on 02/26/2019) 20 tablet 2     Allergies  Allergen Reactions  . Hydrocodone Nausea Only    In high doses   . Nabumetone     Nausea, dizziness as described by the patient     Past Medical History:  Diagnosis Date  . Allergy   . Irregular heartbeat   . Migraine   . Ruptured lumbar disc    No surgery  . Syncope    w/u done in  [facility]    Past Surgical History:  Procedure Laterality Date  . VASECTOMY  05/2013      Observations/Objective: Physical Exam Constitutional:      General: He is not in acute distress.    Appearance: Normal appearance. He is not ill-appearing, toxic-appearing or diaphoretic.  HENT:     Head: Atraumatic.      Comments: No oral or lip swelling, conjunctive appear normal. Eyes:     General:        Right eye: No discharge.        Left eye: No discharge.     Extraocular Movements: Extraocular movements intact.  Pulmonary:     Effort: Pulmonary effort is normal.  Neurological:     Mental Status: He is alert and oriented to person, place, and time.  Psychiatric:        Mood and Affect: Mood normal.        Behavior: Behavior normal.        Thought Content: Thought content normal.        Judgment: Judgment normal.      Assessment and Plan:  1. Rash and nonspecific skin eruption   2. Facial swelling    Unclear etiology for patient's symptoms.  Counseled that it is possible it is related to his Covid vaccine but generally should have more of a systemic reaction if he was reacting to the vaccine.  Counseled on  possibility of a contact dermatitis, developing superficial skin infection.  At this time, do not have enough data to warrant oral steroid or antibiotic course.  Patient is inclined to try a medication and therefore will use low potency hydrocortisone cream.  Counseled on using thin layers over said area twice daily with low threshold to come in for an in person evaluation if there is no improvement or worsening symptoms over the next 2 to 3 days. Counseled patient on potential for adverse effects with medications prescribed/recommended today, ER and return-to-clinic precautions discussed, patient verbalized understanding.    Follow Up Instructions:    I discussed the assessment and treatment plan with the patient. The patient was provided an opportunity to ask questions and all were answered. The patient agreed with the plan and demonstrated an understanding of the instructions.   The patient was advised to call back or seek an in-person evaluation if the symptoms worsen or if the condition fails to improve as anticipated.  I provided 10 minutes of non-face-to-face time during this encounter.    Joe Eagles, PA-C  05/06/2019 11:27 AM         Joe Eagles, PA-C 05/06/19 1139

## 2019-06-24 ENCOUNTER — Encounter: Payer: Self-pay | Admitting: Internal Medicine

## 2019-06-24 ENCOUNTER — Other Ambulatory Visit: Payer: Self-pay

## 2019-06-24 ENCOUNTER — Ambulatory Visit: Payer: 59 | Admitting: Internal Medicine

## 2019-06-24 VITALS — BP 135/87 | HR 87 | Temp 96.9°F | Resp 16 | Ht 76.0 in | Wt 232.4 lb

## 2019-06-24 DIAGNOSIS — R519 Headache, unspecified: Secondary | ICD-10-CM

## 2019-06-24 DIAGNOSIS — R030 Elevated blood-pressure reading, without diagnosis of hypertension: Secondary | ICD-10-CM | POA: Diagnosis not present

## 2019-06-24 DIAGNOSIS — T7840XD Allergy, unspecified, subsequent encounter: Secondary | ICD-10-CM | POA: Diagnosis not present

## 2019-06-24 NOTE — Progress Notes (Signed)
Subjective:    Patient ID: Joe Can., male    DOB: 27-Nov-1970, 49 y.o.   MRN: [id number]  DOS:  06/24/2019 Type of visit - description: Routine office visit Today we talk about headaches, hypertension and allergies. In general he is doing well.   Review of Systems Denies chest pain, difficulty breathing or lower extremity edema  Past Medical History:  Diagnosis Date  . Allergy   . Irregular heartbeat   . Migraine   . Ruptured lumbar disc    No surgery  . Syncope    w/u done in  [facility]    Past Surgical History:  Procedure Laterality Date  . VASECTOMY  05/2013    Allergies as of 06/24/2019      Reactions   Hydrocodone Nausea Only   In high doses    Nabumetone    Nausea, dizziness as described by the patient      Medication List       Accurate as of Jun 24, 2019  8:18 PM. If you have any questions, ask your nurse or doctor.        STOP taking these medications   cyclobenzaprine 10 MG tablet Commonly known as: FLEXERIL Stopped by: Kathlene November, MD   multivitamin tablet Stopped by: Kathlene November, MD     TAKE these medications   fluticasone 50 MCG/ACT nasal spray Commonly known as: FLONASE Place 2 sprays into both nostrils daily as needed for allergies or rhinitis.   hydrocortisone cream 1 % Apply to affected area 2 times daily   MigreLief 200-180-50 MG Tabs Generic drug: Riboflavin-Magnesium-Feverfew Take by mouth.   ondansetron 4 MG disintegrating tablet Commonly known as: Zofran ODT Take 1 tablet (4 mg total) by mouth every 8 (eight) hours as needed for nausea or vomiting.   SUMAtriptan 50 MG tablet Commonly known as: IMITREX TAKE 1 TABLET BY MOUTH WITH ONSET OF HEADACHE, MAY REPEAT IN 2 HOURS IF HEADACHE PERSIST          Objective:   Physical Exam BP 135/87 (BP Location: Left Arm, Patient Position: Sitting, Cuff Size: Normal)   Pulse 87   Temp (!) 96.9 F (36.1 C) (Temporal)   Resp 16   Ht 6\' 4"  (1.93 m)   Wt 232 lb 6 oz (105.4 kg)    SpO2 97%   BMI 28.29 kg/m  General:   Well developed, NAD, BMI noted. HEENT:  Normocephalic . Face symmetric, atraumatic Lungs:  CTA B Normal respiratory effort, no intercostal retractions, no accessory muscle use. Heart: RRR,  no murmur.  Lower extremities: no pretibial edema bilaterally  Skin: Not pale. Not jaundice Neurologic:  alert & oriented X3.  Speech normal, gait appropriate for age and unassisted Psych--  Cognition and judgment appear intact.  Cooperative with normal attention span and concentration.  Behavior appropriate. No anxious or depressed appearing.      Assessment     Assessment H/o migraines H/o syncope, saw cards  ++ PVCs per holter 2015  PLAN Headaches: Since the last office visit he is taking a over-the-counter supplement (Migrelif) which seems to be helping significantly. Did have one episode that required sumatriptan. Recommend to continue with a OTC. Elevated BP: BPs at home ~ 134/82 with a heart rate of 60-70. Only one time after he was very upset BP went up to 142/92.  BP today is very good.  Continue watching salt intake and stay active Allergies: Were significantly worse few weeks ago they are calming down. RTC  6 months CPX This visit occurred during the SARS-CoV-2 public health emergency.  Safety protocols were in place, including screening questions prior to the visit, additional usage of staff PPE, and extensive cleaning of exam room while observing appropriate contact time as indicated for disinfecting solutions.

## 2019-06-24 NOTE — Patient Instructions (Signed)
Continue checking your BPs BP GOAL is between 110/65 and  135/85. If it is consistently higher or lower, let me know    GO TO THE FRONT DESK, PLEASE SCHEDULE YOUR APPOINTMENTS Come back for a physical in 6 months

## 2019-06-24 NOTE — Assessment & Plan Note (Signed)
Headaches: Since the last office visit he is taking a over-the-counter supplement (Migrelif) which seems to be helping significantly. Did have one episode that required sumatriptan. Recommend to continue with a OTC. Elevated BP: BPs at home ~ 134/82 with a heart rate of 60-70. Only one time after he was very upset BP went up to 142/92.  BP today is very good.  Continue watching salt intake and stay active Allergies: Were significantly worse few weeks ago they are calming down. RTC 6 months CPX

## 2019-06-24 NOTE — Progress Notes (Signed)
Pre visit review using our clinic review tool, if applicable. No additional management support is needed unless otherwise documented below in the visit note. 

## 2019-06-25 MED ORDER — SUMATRIPTAN SUCCINATE 50 MG PO TABS: ORAL_TABLET | ORAL | 2 refills | Status: DC

## 2019-09-17 ENCOUNTER — Other Ambulatory Visit: Payer: Self-pay

## 2019-09-17 ENCOUNTER — Ambulatory Visit: Payer: 59 | Admitting: Internal Medicine

## 2019-09-17 VITALS — BP 144/88 | HR 76 | Temp 98.4°F | Resp 12 | Ht 76.0 in | Wt 235.2 lb

## 2019-09-17 DIAGNOSIS — R399 Unspecified symptoms and signs involving the genitourinary system: Secondary | ICD-10-CM

## 2019-09-17 DIAGNOSIS — I10 Essential (primary) hypertension: Secondary | ICD-10-CM | POA: Diagnosis not present

## 2019-09-17 MED ORDER — AMLODIPINE BESYLATE 5 MG PO TABS: 5.0000 mg | ORAL_TABLET | Freq: Every day | ORAL | 6 refills | Status: DC

## 2019-09-17 NOTE — Patient Instructions (Addendum)
Check the  blood pressure weekly BP GOAL is between 110/65 and  135/85. If it is consistently higher or lower, let me know    GO TO THE LAB : Get the blood work     [place] back for a physical by 12-2019

## 2019-09-17 NOTE — Progress Notes (Signed)
Subjective:    Patient ID: Joe Can., male    DOB: 01/07/1971, 49 y.o.   MRN: [id number]  DOS:  09/17/2019 Type of visit - description: Follow-up Here for BP evaluation. Since the last time had had a number of BP checks. Occasionally has dysuria, no fever chills or gross hematuria.  Would like to recheck for UTIs  Review of Systems Denies headache, chest pain, difficulty breathing.  No edema.   Past Medical History:  Diagnosis Date   Allergy    Irregular heartbeat    Migraine    Ruptured lumbar disc    No surgery   Syncope    [facility]u done in  [facility]    Past Surgical History:  Procedure Laterality Date   VASECTOMY  05/2013    Allergies as of 09/17/2019      Reactions   Hydrocodone Nausea Only   In high doses    Nabumetone    Nausea, dizziness as described by the patient      Medication List       Accurate as of September 17, 2019 11:59 PM. If you have any questions, ask your nurse or doctor.        STOP taking these medications   fluticasone 50 MCG/ACT nasal spray Commonly known as: FLONASE Stopped by: Kathlene November, MD   hydrocortisone cream 1 % Stopped by: Kathlene November, MD   ondansetron 4 MG disintegrating tablet Commonly known as: Zofran ODT Stopped by: Kathlene November, MD     TAKE these medications   amLODipine 5 MG tablet Commonly known as: NORVASC Take 1 tablet (5 mg total) by mouth daily. Started by: Kathlene November, MD   MigreLief 200-180-50 MG Tabs Generic drug: Riboflavin-Magnesium-Feverfew Take by mouth.   SUMAtriptan 50 MG tablet Commonly known as: IMITREX TAKE 1 TABLET BY MOUTH WITH ONSET OF HEADACHE, MAY REPEAT IN 2 HOURS IF HEADACHE PERSIST          Objective:   Physical Exam BP (!) 144/88 (BP Location: Left Arm, Cuff Size: Large)    Pulse 76    Temp 98.4 F (36.9 C) (Oral)    Resp 12    Ht 6\' 4"  (1.93 m)    Wt 235 lb 3.2 oz (106.7 kg)    SpO2 98%    BMI 28.63 kg/m  General:   Well developed, NAD, BMI noted. HEENT:  Normocephalic . Face  symmetric, atraumatic  Lower extremities: no pretibial edema bilaterally  Skin: Not pale. Not jaundice Neurologic:  alert & oriented X3.  Speech normal, gait appropriate for age and unassisted Psych--  Cognition and judgment appear intact.  Cooperative with normal attention span and concentration.  Behavior appropriate. No anxious or depressed appearing.      Assessment     Assessment HTN dx 09/2019  H/o migraines H/o syncope, saw cards  ++ PVCs per holter 2015  PLAN Hypertension: BPs has been checked multiple times since the last office visit: 142/101, 158/100, 138/92. 150/90. Based on this number he has mild hypertension, he already follows a low-salt diet and he is active. Plan: BMP, start amlodipine, continue monitoring BPs, watch for edema. LUTS: Mild dysuria, patient concerned because wife has a UTI, check a UA urine culture per patient request. Migraines: Taking over-the-counter migraine relief supplement since March.  Related to increased BP?  That is unclear, recommend to hold the supplement for now RTC 12/2019 CPX   This visit occurred during the SARS-CoV-2 public health emergency.  Safety protocols were  in place, including screening questions prior to the visit, additional usage of staff PPE, and extensive cleaning of exam room while observing appropriate contact time as indicated for disinfecting solutions.

## 2019-09-18 LAB — BASIC METABOLIC PANEL
BUN: 11 mg/dL (ref 6–23)
CO2: 25 mEq/L (ref 19–32)
Calcium: 9.4 mg/dL (ref 8.4–10.5)
Chloride: 102 mEq/L (ref 96–112)
Creatinine, Ser: 1.22 mg/dL (ref 0.40–1.50)
GFR: 62.51 mL/min (ref >60.00)
Glucose, Bld: 102 mg/dL — ABNORMAL HIGH (ref 70–99)
Potassium: 4.2 mEq/L (ref 3.5–5.1)
Sodium: 137 mEq/L (ref 135–145)

## 2019-09-18 LAB — URINALYSIS, ROUTINE W REFLEX MICROSCOPIC
Bilirubin Urine: NEGATIVE
Hgb urine dipstick: NEGATIVE
Ketones, ur: NEGATIVE
Leukocytes,Ua: NEGATIVE
Nitrite: NEGATIVE
RBC / HPF: NONE SEEN (ref 0–?)
Specific Gravity, Urine: <=1.005 — AB (ref 1.000–1.030)
Total Protein, Urine: NEGATIVE
Urine Glucose: NEGATIVE
Urobilinogen, UA: 0.2 (ref 0.0–1.0)
WBC, UA: NONE SEEN (ref 0–?)
pH: 6 (ref 5.0–8.0)

## 2019-09-18 LAB — URINE CULTURE
MICRO NUMBER:: 10808569
Result:: NO GROWTH
SPECIMEN QUALITY:: ADEQUATE

## 2019-09-18 NOTE — Assessment & Plan Note (Signed)
Hypertension: BPs has been checked multiple times since the last office visit: 142/101, 158/100, 138/92. 150/90. Based on this number he has mild hypertension, he already follows a low-salt diet and he is active. Plan: BMP, start amlodipine, continue monitoring BPs, watch for edema. LUTS: Mild dysuria, patient concerned because wife has a UTI, check a UA urine culture per patient request. Migraines: Taking over-the-counter migraine relief supplement since March.  Related to increased BP?  That is unclear, recommend to hold the supplement for now RTC 12/2019 CPX

## 2019-10-16 ENCOUNTER — Other Ambulatory Visit: Payer: Self-pay

## 2019-10-31 ENCOUNTER — Encounter: Payer: Self-pay | Admitting: Internal Medicine

## 2019-10-31 ENCOUNTER — Other Ambulatory Visit: Payer: Self-pay | Admitting: Internal Medicine

## 2019-10-31 MED ORDER — AMLODIPINE BESYLATE 10 MG PO TABS: 10.0000 mg | ORAL_TABLET | Freq: Every day | ORAL | 6 refills | Status: DC

## 2019-12-31 ENCOUNTER — Ambulatory Visit (INDEPENDENT_AMBULATORY_CARE_PROVIDER_SITE_OTHER): Payer: 59 | Admitting: Internal Medicine

## 2019-12-31 ENCOUNTER — Encounter: Payer: Self-pay | Admitting: Internal Medicine

## 2019-12-31 ENCOUNTER — Other Ambulatory Visit: Payer: Self-pay

## 2019-12-31 VITALS — BP 144/89 | HR 78 | Temp 97.6°F | Ht 76.0 in | Wt 235.0 lb

## 2019-12-31 DIAGNOSIS — Z125 Encounter for screening for malignant neoplasm of prostate: Secondary | ICD-10-CM | POA: Diagnosis not present

## 2019-12-31 DIAGNOSIS — Z0000 Encounter for general adult medical examination without abnormal findings: Secondary | ICD-10-CM

## 2019-12-31 LAB — LIPID PANEL
Cholesterol: 164 mg/dL (ref 0–200)
HDL: 39.3 mg/dL (ref >39.00)
LDL Cholesterol: 99 mg/dL (ref 0–99)
NonHDL: 124.2
Total CHOL/HDL Ratio: 4
Triglycerides: 126 mg/dL (ref 0.0–149.0)
VLDL: 25.2 mg/dL (ref 0.0–40.0)

## 2019-12-31 LAB — CBC WITH DIFFERENTIAL/PLATELET
Basophils Absolute: 0 10*3/uL (ref 0.0–0.1)
Basophils Relative: 1.3 % (ref 0.0–3.0)
Eosinophils Absolute: 0.2 10*3/uL (ref 0.0–0.7)
Eosinophils Relative: 4.5 % (ref 0.0–5.0)
HCT: 43.7 % (ref 39.0–52.0)
Hemoglobin: 14.9 g/dL (ref 13.0–17.0)
Lymphocytes Relative: 28.1 % (ref 12.0–46.0)
Lymphs Abs: 1 10*3/uL (ref 0.7–4.0)
MCHC: 34.2 g/dL (ref 30.0–36.0)
MCV: 86.8 fl (ref 78.0–100.0)
Monocytes Absolute: 0.3 10*3/uL (ref 0.1–1.0)
Monocytes Relative: 8.8 % (ref 3.0–12.0)
Neutro Abs: 2 10*3/uL (ref 1.4–7.7)
Neutrophils Relative %: 57.3 % (ref 43.0–77.0)
Platelets: 298 10*3/uL (ref 150.0–400.0)
RBC: 5.04 Mil/uL (ref 4.22–5.81)
RDW: 12.9 % (ref 11.5–15.5)
WBC: 3.5 10*3/uL — ABNORMAL LOW (ref 4.0–10.5)

## 2019-12-31 LAB — COMPREHENSIVE METABOLIC PANEL
ALT: 20 U/L (ref 0–53)
AST: 23 U/L (ref 0–37)
Albumin: 4.3 g/dL (ref 3.5–5.2)
Alkaline Phosphatase: 56 U/L (ref 39–117)
BUN: 11 mg/dL (ref 6–23)
CO2: 30 mEq/L (ref 19–32)
Calcium: 9.5 mg/dL (ref 8.4–10.5)
Chloride: 103 mEq/L (ref 96–112)
Creatinine, Ser: 1.17 mg/dL (ref 0.40–1.50)
GFR: 72.06 mL/min (ref >60.00)
Glucose, Bld: 89 mg/dL (ref 70–99)
Potassium: 4.2 mEq/L (ref 3.5–5.1)
Sodium: 139 mEq/L (ref 135–145)
Total Bilirubin: 0.6 mg/dL (ref 0.2–1.2)
Total Protein: 6.8 g/dL (ref 6.0–8.3)

## 2019-12-31 LAB — PSA: PSA: 1.59 ng/mL (ref 0.10–4.00)

## 2019-12-31 LAB — TSH: TSH: 1.79 u[IU]/mL (ref 0.35–4.50)

## 2019-12-31 MED ORDER — AMLODIPINE BESYLATE 10 MG PO TABS: 10.0000 mg | ORAL_TABLET | Freq: Every day | ORAL | 12 refills | Status: DC

## 2019-12-31 NOTE — Assessment & Plan Note (Signed)
Here for CPX HTN: Started amlodipine 09-2019, 5 mg were increased to 10 mg. Ambulatory BPs: 137/88, 144/84, 134/81.  No side effects. Continue with low-salt diet, physical activity, amlodipine 10 mg monitor BPs, call sooner than 1 year if needed. Migraines: Decreased, hardly ever has sxs. RTC 1 year.

## 2019-12-31 NOTE — Assessment & Plan Note (Signed)
-  Tdap 2018 - covid vax x 3 (booster 12-26-2019) -s/p Flu shot  - CCS: cscope 02/2019, next per GI -+FH Prostate ca: DRE PSA 2020 wnl , recheck PSA -FH CAD - pt is asx  -Labs:CMP, FLP, CBC, TSH,PSA -Diet exercise: Doing well.

## 2019-12-31 NOTE — Progress Notes (Signed)
   Subjective:    Patient ID: Joe Can., male    DOB: 23-Aug-1970, 49 y.o.   MRN: [id number]  DOS:  12/31/2019 Type of visit - description: cpx  Since the last office visit he is doing well.  Review of Systems   A 14 point review of systems is negative    Past Medical History:  Diagnosis Date  . Allergy   . Irregular heartbeat   . Migraine   . Ruptured lumbar disc    No surgery  . Syncope    [facility]/u done in  [facility]    Past Surgical History:  Procedure Laterality Date  . VASECTOMY  05/2013    Allergies as of 12/31/2019      Reactions   Hydrocodone Nausea Only   In high doses    Nabumetone    Nausea, dizziness as described by the patient      Medication List       Accurate as of December 31, 2019 11:24 AM. If you have any questions, ask your nurse or doctor.        STOP taking these medications   MigreLief 200-180-50 MG Tabs Generic drug: Riboflavin-Magnesium-Feverfew Stopped by: Kathlene November, MD     TAKE these medications   amLODipine 10 MG tablet Commonly known as: NORVASC Take 1 tablet (10 mg total) by mouth daily.   SUMAtriptan 50 MG tablet Commonly known as: IMITREX TAKE 1 TABLET BY MOUTH WITH ONSET OF HEADACHE, MAY REPEAT IN 2 HOURS IF HEADACHE PERSIST          Objective:   Physical Exam BP (!) 144/89 (BP Location: Right Arm, Patient Position: Sitting, Cuff Size: Large)   Pulse 78   Temp 97.6 F (36.4 C) (Oral)   Ht 6\' 4"  (1.93 m)   Wt 235 lb (106.6 kg)   SpO2 99%   BMI 28.61 kg/m  General: Well developed, NAD, BMI noted Neck: No  thyromegaly  HEENT:  Normocephalic . Face symmetric, atraumatic Lungs:  CTA B Normal respiratory effort, no intercostal retractions, no accessory muscle use. Heart: RRR,  no murmur.  Abdomen:  Not distended, soft, non-tender. No rebound or rigidity.   Lower extremities: no pretibial edema bilaterally  Skin: Exposed areas without rash. Not pale. Not jaundice Neurologic:  alert & oriented X3.  Speech  normal, gait appropriate for age and unassisted Strength symmetric and appropriate for age.  Psych: Cognition and judgment appear intact.  Cooperative with normal attention span and concentration.  Behavior appropriate. No anxious or depressed appearing.     Assessment     Assessment HTN dx 09/2019  H/o migraines H/o syncope, saw cards  ++ PVCs per holter 2015  PLAN Here for CPX HTN: Started amlodipine 09-2019, 5 mg were increased to 10 mg. Ambulatory BPs: 137/88, 144/84, 134/81.  No side effects. Continue with low-salt diet, physical activity, amlodipine 10 mg monitor BPs, call sooner than 1 year if needed. Migraines: Decreased, hardly ever has sxs. RTC 1 year.   This visit occurred during the SARS-CoV-2 public health emergency.  Safety protocols were in place, including screening questions prior to the visit, additional usage of staff PPE, and extensive cleaning of exam room while observing appropriate contact time as indicated for disinfecting solutions.

## 2019-12-31 NOTE — Patient Instructions (Signed)
Check the  blood pressure   weekly   BP GOAL is between 110/65 and  135/85. If it is consistently higher or lower, let me know    GO TO THE LAB : Get the blood work     [place], [place] back for a physical exam in 1 year

## 2020-03-02 ENCOUNTER — Encounter: Payer: Self-pay | Admitting: Internal Medicine

## 2020-03-02 MED ORDER — AMLODIPINE BESYLATE 10 MG PO TABS: 10.0000 mg | ORAL_TABLET | Freq: Every day | ORAL | 3 refills | Status: DC

## 2020-04-14 ENCOUNTER — Encounter: Payer: Self-pay | Admitting: Internal Medicine

## 2020-04-14 ENCOUNTER — Telehealth: Payer: Self-pay | Admitting: Internal Medicine

## 2020-04-14 NOTE — Telephone Encounter (Signed)
LMOM asking for return call or mychart message to let us know when he symptoms started.

## 2020-04-14 NOTE — Telephone Encounter (Signed)
Spoke with the patient, symptoms a started approximately 10 days ago. Symptoms are decreasing compared to few days ago. Currently with no fever, chills, difficulty breathing. Does have some chest congestion and mild DOE. We agreed that he will come back tomorrow for evaluation, he is concerned about Covid complications.

## 2020-04-15 ENCOUNTER — Other Ambulatory Visit: Payer: Self-pay

## 2020-04-15 ENCOUNTER — Ambulatory Visit (HOSPITAL_BASED_OUTPATIENT_CLINIC_OR_DEPARTMENT_OTHER)
Admission: RE | Admit: 2020-04-15 | Discharge: 2020-04-15 | Disposition: A | Discharge status: Home / Self Care | Payer: 59 | Source: Ambulatory Visit | Attending: Internal Medicine | Admitting: Internal Medicine

## 2020-04-15 ENCOUNTER — Ambulatory Visit (INDEPENDENT_AMBULATORY_CARE_PROVIDER_SITE_OTHER): Payer: 59 | Admitting: Internal Medicine

## 2020-04-15 ENCOUNTER — Encounter: Payer: Self-pay | Admitting: Internal Medicine

## 2020-04-15 VITALS — BP 128/74 | HR 58 | Temp 97.9°F | Resp 17 | Ht 76.0 in | Wt 235.0 lb

## 2020-04-15 DIAGNOSIS — U071 COVID-19: Secondary | ICD-10-CM | POA: Diagnosis present

## 2020-04-15 NOTE — Progress Notes (Signed)
Subjective:    Patient ID: Joe Castro., male    DOB: July 17, 1970, 50 y.o.   MRN: [id number]  DOS:  04/15/2020 Type of visit - description: Acute, Covid infection.  Symptoms a started 2 days ago: Very fatigued, URI type of symptoms, + aches and pains particularly at the mid back. Se had a rapid Covid test 04/09/2020 and it came back positive. Symptoms continue several days, they are starting to get better.  Used to walk 1 mile without problems, now at the end of 1 mile he feels some DOE. Took some Tylenol and Claritin-D otherwise nothing except his regular medications   Review of Systems No fever, had chills one time. No chest pain No difficulty breathing at rest No nausea vomiting No rash No headache or dizziness  Past Medical History:  Diagnosis Date  . Allergy   . Irregular heartbeat   . Migraine   . Ruptured lumbar disc    No surgery  . Syncope    [facility]/u done in  [facility]    Past Surgical History:  Procedure Laterality Date  . VASECTOMY  05/2013    Allergies as of 04/15/2020      Reactions   Hydrocodone Nausea Only   In high doses    Nabumetone    Nausea, dizziness as described by the patient      Medication List       Accurate as of April 15, 2020 11:59 PM. If you have any questions, ask your nurse or doctor.        amLODipine 10 MG tablet Commonly known as: NORVASC Take 1 tablet (10 mg total) by mouth daily.   SUMAtriptan 50 MG tablet Commonly known as: IMITREX TAKE 1 TABLET BY MOUTH WITH ONSET OF HEADACHE, MAY REPEAT IN 2 HOURS IF HEADACHE PERSIST          Objective:   Physical Exam BP 128/74 (BP Location: Right Arm, Patient Position: Sitting, Cuff Size: Normal)   Pulse (!) 58   Temp 97.9 F (36.6 C)   Resp 17   Ht 6\' 4"  (1.93 m)   Wt 235 lb (106.6 kg)   SpO2 94%   BMI 28.61 kg/m  General:   Well developed, NAD, BMI noted. HEENT:  Normocephalic . Face symmetric, atraumatic Lungs:  CTA B Normal respiratory effort, no intercostal  retractions, no accessory muscle use. Heart: RRR,  no murmur.  Lower extremities: no pretibial edema bilaterally  Skin: Not pale. Not jaundice Neurologic:  alert & oriented X3.  Speech normal, gait appropriate for age and unassisted Psych--  Cognition and judgment appear intact.  Cooperative with normal attention span and concentration.  Behavior appropriate. No anxious or depressed appearing.      Assessment     Assessment HTN dx 09/2019  H/o migraines H/o syncope, saw cards  ++ PVCs per holter 2015  PLAN COVID-19: Patient is 13, triple vaccinated, symptoms started 10 days ago, infection documented via a rapid test at home. Overall he is improving. His O2 sat today was 94% upon arrival, I rechecked: 93%, last O2 sat in the chart was 99%. Anticipate he will gradually improve, asked patient to pay attention to his symptoms, if he is not getting better or he gets suddenly worse (chest pain, severe shortness of breath etc.) he is to let me know. We will get a chest x-ray. Work note provided.    This visit occurred during the SARS-CoV-2 public health emergency.  Safety protocols were in place, including  screening questions prior to the visit, additional usage of staff PPE, and extensive cleaning of exam room while observing appropriate contact time as indicated for disinfecting solutions.

## 2020-04-15 NOTE — Patient Instructions (Signed)
Go to the first floor and get your x-ray  Rest  Check your oxygen levels, they should be around 94% or more.  It should improved in the next few weeks to 99%  You can get a pulse oximeter at your local pharmacy or [facility].  Call anytime if you have increased symptoms, high fever, chest pain, increased difficulty breathing.  Also let me know if you are not gradually improving in the next 2 to 3 weeks.

## 2020-04-16 NOTE — Assessment & Plan Note (Signed)
COVID-19: Patient is 14, triple vaccinated, symptoms started 10 days ago, infection documented via a rapid test at home. Overall he is improving. His O2 sat today was 94% upon arrival, I rechecked: 93%, last O2 sat in the chart was 99%. Anticipate he will gradually improve, asked patient to pay attention to his symptoms, if he is not getting better or he gets suddenly worse (chest pain, severe shortness of breath etc.) he is to let me know. We will get a chest x-ray. Work note provided.

## 2021-01-01 ENCOUNTER — Encounter: Payer: 59 | Admitting: Internal Medicine

## 2021-01-06 ENCOUNTER — Ambulatory Visit (INDEPENDENT_AMBULATORY_CARE_PROVIDER_SITE_OTHER): Payer: 59 | Admitting: Internal Medicine

## 2021-01-06 ENCOUNTER — Encounter: Payer: Self-pay | Admitting: Internal Medicine

## 2021-01-06 ENCOUNTER — Other Ambulatory Visit: Payer: Self-pay

## 2021-01-06 VITALS — BP 122/82 | HR 72 | Temp 97.8°F | Resp 16 | Ht 76.0 in | Wt 239.5 lb

## 2021-01-06 DIAGNOSIS — Z0000 Encounter for general adult medical examination without abnormal findings: Secondary | ICD-10-CM

## 2021-01-06 DIAGNOSIS — Z23 Encounter for immunization: Secondary | ICD-10-CM

## 2021-01-06 DIAGNOSIS — I10 Essential (primary) hypertension: Secondary | ICD-10-CM

## 2021-01-06 DIAGNOSIS — Z1159 Encounter for screening for other viral diseases: Secondary | ICD-10-CM

## 2021-01-06 LAB — CBC WITH DIFFERENTIAL/PLATELET
Basophils Absolute: 0 10*3/uL (ref 0.0–0.1)
Basophils Relative: 1.3 % (ref 0.0–3.0)
Eosinophils Absolute: 0.1 10*3/uL (ref 0.0–0.7)
Eosinophils Relative: 2.9 % (ref 0.0–5.0)
HCT: 45.4 % (ref 39.0–52.0)
Hemoglobin: 15.2 g/dL (ref 13.0–17.0)
Lymphocytes Relative: 25.8 % (ref 12.0–46.0)
Lymphs Abs: 0.9 10*3/uL (ref 0.7–4.0)
MCHC: 33.4 g/dL (ref 30.0–36.0)
MCV: 87.2 fl (ref 78.0–100.0)
Monocytes Absolute: 0.3 10*3/uL (ref 0.1–1.0)
Monocytes Relative: 8.5 % (ref 3.0–12.0)
Neutro Abs: 2.2 10*3/uL (ref 1.4–7.7)
Neutrophils Relative %: 61.5 % (ref 43.0–77.0)
Platelets: 288 10*3/uL (ref 150.0–400.0)
RBC: 5.21 Mil/uL (ref 4.22–5.81)
RDW: 13 % (ref 11.5–15.5)
WBC: 3.6 10*3/uL — ABNORMAL LOW (ref 4.0–10.5)

## 2021-01-06 LAB — COMPREHENSIVE METABOLIC PANEL
ALT: 22 U/L (ref 0–53)
AST: 20 U/L (ref 0–37)
Albumin: 4.4 g/dL (ref 3.5–5.2)
Alkaline Phosphatase: 49 U/L (ref 39–117)
BUN: 13 mg/dL (ref 6–23)
CO2: 30 mEq/L (ref 19–32)
Calcium: 9.5 mg/dL (ref 8.4–10.5)
Chloride: 103 mEq/L (ref 96–112)
Creatinine, Ser: 1.15 mg/dL (ref 0.40–1.50)
GFR: 73.04 mL/min (ref >60.00)
Glucose, Bld: 91 mg/dL (ref 70–99)
Potassium: 4.2 mEq/L (ref 3.5–5.1)
Sodium: 140 mEq/L (ref 135–145)
Total Bilirubin: 0.8 mg/dL (ref 0.2–1.2)
Total Protein: 7 g/dL (ref 6.0–8.3)

## 2021-01-06 LAB — HEPATITIS C ANTIBODY
Hepatitis C Ab: NONREACTIVE
SIGNAL TO CUT-OFF: 0.08 (ref <1.00)

## 2021-01-06 LAB — LIPID PANEL
Cholesterol: 203 mg/dL — ABNORMAL HIGH (ref 0–200)
HDL: 45.7 mg/dL (ref >39.00)
LDL Cholesterol: 129 mg/dL — ABNORMAL HIGH (ref 0–99)
NonHDL: 156.95
Total CHOL/HDL Ratio: 4
Triglycerides: 141 mg/dL (ref 0.0–149.0)
VLDL: 28.2 mg/dL (ref 0.0–40.0)

## 2021-01-06 LAB — PSA: PSA: 1.58 ng/mL (ref 0.10–4.00)

## 2021-01-06 MED ORDER — AMLODIPINE BESYLATE 5 MG PO TABS: 5.0000 mg | ORAL_TABLET | Freq: Two times a day (BID) | ORAL | 3 refills | Status: DC

## 2021-01-06 NOTE — Assessment & Plan Note (Signed)
Here for CPX HTN: Ambulatory BPs at home 120/80, on amlodipine 10 mg, he feels a slightly flushed after amlodipine. Plan: Break amlodipine to 5 mg twice daily, if flushing continue let me know. Monitor BP OSA: Dx 3 months ago, he was essentially asx however his wife had a study so he decided to do the same.  Test showed severe OSA according to the patient.  Tolerating CPAP very well. RTC 1 year

## 2021-01-06 NOTE — Assessment & Plan Note (Signed)
-  Tdap 2018 - Shingrix No. 1 today, next in 2 to 6 months. - covid vax last booster 10-2020 (bivalent) -s/p Flu shot  - CCS: cscope 02/2019, next per GI -+FH Prostate ca: DRE wnl, labs  -FH CAD - pt is asx, start aspirin 81 (patient is concerned about nosebleeds, he will let me know if that is the case) -Labs:CMP, FLP, CBC, PSA, hep C -Diet exercise: Counseled - Advance care planning info package provided

## 2021-01-06 NOTE — Patient Instructions (Signed)
Change  amlodipine to 5 mg twice daily Check the  blood pressure regularly BP GOAL is between 110/65 and  135/85. If it is consistently higher or lower, let me know   Next shingles shot : anytime between 2 to 6 months from today  [facility] : Get the blood work     [place], [place] back for a physical exam in 1 year

## 2021-01-06 NOTE — Progress Notes (Signed)
Subjective:    Patient ID: Joe Can., male    DOB: April 11, 1970, 50 y.o.   MRN: [id number]  DOS:  01/06/2021 Type of visit - description: CPX  Since the last visit, patient was diagnosed with sleep apnea.  Using a CPAP. He is taking amlodipine 10 mg, about 1 hour after he gets the medication he gets slightly flushed in the face.  Denies itching or redness anywhere. Ambulatory BPs are great.   Review of Systems  Other than above, a 14 point review of systems is negative    Past Medical History:  Diagnosis Date   Allergy    Irregular heartbeat    Migraine    Ruptured lumbar disc    No surgery   Sleep apnea    on cpap   Syncope    [facility]/u done in  [facility]    Past Surgical History:  Procedure Laterality Date   VASECTOMY  05/2013   Social History   Socioeconomic History   Marital status: Married    Spouse name: Not on file   Number of children: 2   Years of education: Not on file   Highest education level: Bachelor's degree (e.g., BA, AB, BS)  Occupational History   Occupation: works for the [employer]   Smoking status: Never   Smokeless tobacco: Never  Vaping Use   Vaping Use: Never used  Substance and Sexual Activity   Alcohol use: Yes    Comment: social-beer   Drug use: No   Sexual activity: Not on file  Other Topics Concern   Not on file  Social History Narrative   House hold: pt and wife  and a daughter    LOST HIS MOTHER 04/2017. His father lives at a ILF   Right handed   Caffeine: daily coffee 1 cup   Social Determinants of Health   Financial Resource Strain: Not on file  Food Insecurity: Not on file  Transportation Needs: Not on file  Physical Activity: Not on file  Stress: Not on file  Social Connections: Not on file  Intimate Partner Violence: Not on file     Allergies as of 01/06/2021       Reactions   Hydrocodone Nausea Only   In high doses    Nabumetone    Nausea, dizziness as described by the patient         Medication List        Accurate as of January 06, 2021  5:49 PM. If you have any questions, ask your nurse or doctor.          amLODipine 5 MG tablet Commonly known as: NORVASC Take 1 tablet (5 mg total) by mouth in the morning and at bedtime. What changed:  medication strength how much to take when to take this Changed by: Kathlene November, MD   aspirin EC 81 MG tablet Take 81 mg by mouth daily. Swallow whole.   SUMAtriptan 50 MG tablet Commonly known as: IMITREX TAKE 1 TABLET BY MOUTH WITH ONSET OF HEADACHE, MAY REPEAT IN 2 HOURS IF HEADACHE PERSIST           Objective:   Physical Exam BP 122/82 (BP Location: Left Arm, Patient Position: Sitting, Cuff Size: Normal)   Pulse 72   Temp 97.8 F (36.6 C) (Oral)   Resp 16   Ht 6\' 4"  (1.93 m)   Wt 239 lb 8 oz (108.6 kg)   SpO2 98%   BMI  29.15 kg/m  General: Well developed, NAD, BMI noted Neck: No  thyromegaly  HEENT:  Normocephalic . Face symmetric, atraumatic Lungs:  CTA B Normal respiratory effort, no intercostal retractions, no accessory muscle use. Heart: RRR,  no murmur.  Abdomen:  Not distended, soft, non-tender. No rebound or rigidity.   Lower extremities: no pretibial edema bilaterally DRE: Normal sphincter tone, brown stools, prostate normal Skin: Exposed areas without rash. Not pale. Not jaundice Neurologic:  alert & oriented X3.  Speech normal, gait appropriate for age and unassisted Strength symmetric and appropriate for age.  Psych: Cognition and judgment appear intact.  Cooperative with normal attention span and concentration.  Behavior appropriate. No anxious or depressed appearing.     Assessment    Assessment HTN dx 09/2019  OSA dx 09-2020, +CPAP  (@[facility] ) H/o migraines H/o syncope, saw cards  ++ PVCs per holter 2015 + FH CAD  PLAN Here for CPX HTN: Ambulatory BPs at home 120/80, on amlodipine 10 mg, he feels a slightly flushed after amlodipine. Plan: Break amlodipine to 5 mg twice  daily, if flushing continue let me know. Monitor BP OSA: Dx 3 months ago, he was essentially asx however his wife had a study so he decided to do the same.  Test showed severe OSA according to the patient.  Tolerating CPAP very well. RTC 1 year   This visit occurred during the SARS-CoV-2 public health emergency.  Safety protocols were in place, including screening questions prior to the visit, additional usage of staff PPE, and extensive cleaning of exam room while observing appropriate contact time as indicated for disinfecting solutions.

## 2021-05-22 ENCOUNTER — Emergency Department (HOSPITAL_BASED_OUTPATIENT_CLINIC_OR_DEPARTMENT_OTHER): Payer: 59

## 2021-05-22 ENCOUNTER — Encounter (HOSPITAL_BASED_OUTPATIENT_CLINIC_OR_DEPARTMENT_OTHER): Payer: Self-pay | Admitting: Emergency Medicine

## 2021-05-22 ENCOUNTER — Emergency Department (HOSPITAL_BASED_OUTPATIENT_CLINIC_OR_DEPARTMENT_OTHER)
Admission: EM | Admit: 2021-05-22 | Discharge: 2021-05-22 | Disposition: A | Discharge status: Home / Self Care | Payer: 59 | Attending: Emergency Medicine | Admitting: Emergency Medicine

## 2021-05-22 ENCOUNTER — Other Ambulatory Visit: Payer: Self-pay

## 2021-05-22 DIAGNOSIS — S60212A Contusion of left wrist, initial encounter: Secondary | ICD-10-CM

## 2021-05-22 DIAGNOSIS — W232XXA Caught, crushed, jammed or pinched between a moving and stationary object, initial encounter: Secondary | ICD-10-CM | POA: Diagnosis not present

## 2021-05-22 DIAGNOSIS — Z7982 Long term (current) use of aspirin: Secondary | ICD-10-CM | POA: Diagnosis not present

## 2021-05-22 DIAGNOSIS — S6732XA Crushing injury of left wrist, initial encounter: Secondary | ICD-10-CM | POA: Diagnosis present

## 2021-05-22 NOTE — ED Triage Notes (Signed)
Pt c/o left wrist injury. Reports his wrist was crushed between gutter and ladder. Swelling noted in area.  ?

## 2021-05-22 NOTE — ED Provider Notes (Signed)
?[facility] ?Provider Note ? ? ?CSN: [id number] ?Arrival date & time: 05/22/21  1915 ? ?  ? ?History ? ?Chief Complaint  ?Patient presents with  ? Wrist Pain  ? ? ?Joe Castro. is a 51 y.o. male. ? ?Pt reports he crushed wrist with a ladder.  Pt complains of swelling and pain ? ?The history is provided by the patient. No language interpreter was used.  ?Wrist Pain ?This is a new problem. The current episode started 3 to 5 hours ago. The problem occurs constantly. The problem has not changed since onset.Nothing aggravates the symptoms. Nothing relieves the symptoms. He has tried nothing for the symptoms. The treatment provided no relief.  ? ?  ? ?Home Medications ?Prior to Admission medications   ?Medication Sig Start Date End Date Taking? Authorizing Provider  ?amLODipine (NORVASC) 5 MG tablet Take 1 tablet (5 mg total) by mouth in the morning and at bedtime. 01/06/21   Colon Branch, MD  ?aspirin EC 81 MG tablet Take 81 mg by mouth daily. Swallow whole.    [provider]  ?SUMAtriptan (IMITREX) 50 MG tablet TAKE 1 TABLET BY MOUTH WITH ONSET OF HEADACHE, MAY REPEAT IN 2 HOURS IF HEADACHE PERSIST 06/25/19   Colon Branch, MD  ?   ? ?Allergies    ?Hydrocodone and Nabumetone   ? ?Review of Systems   ?Review of Systems  ?All other systems reviewed and are negative. ? ?Physical Exam ?Updated Vital Signs ?BP (!) 152/97 (BP Location: Right Arm)   Pulse 78   Temp 98.3 ?F (36.8 ?C) (Oral)   Resp 18   SpO2 97%  ?Physical Exam ?Vitals reviewed.  ?Constitutional:   ?   Appearance: Normal appearance.  ?Musculoskeletal:     ?   General: Swelling and tenderness present.  ?   Comments: Tender wrist, swollen bruised nv and ns intact   ?Skin: ?   General: Skin is warm.  ?Neurological:  ?   General: No focal deficit present.  ?   Mental Status: He is alert.  ?Psychiatric:     ?   Mood and Affect: Mood normal.  ? ? ?ED Results / Procedures / Treatments   ?Labs ?(all labs ordered are listed,  but only abnormal results are displayed) ?Labs Reviewed - No data to display ? ?EKG ?None ? ?Radiology ?DG Wrist Complete Left ? ?Result Date: 05/22/2021 ?CLINICAL DATA:  wrist injury EXAM: LEFT WRIST - COMPLETE 3+ VIEW COMPARISON:  None. FINDINGS: There is no evidence of fracture or dislocation. Dense sclerotic lesion within the capitate likely representing a bone island. There is no evidence of arthropathy or other focal bone abnormality. Soft tissues are unremarkable. IMPRESSION: No acute displaced fracture or dislocation. Electronically Signed   By: Iven Finn M.D.   On: 05/22/2021 19:34   ? ?Procedures ?Procedures  ? ? ?Medications Ordered in ED ?Medications - No data to display ? ?ED Course/ Medical Decision Making/ A&P ?  ?                        ?Medical Decision Making ?Pt reports ladder shifted and he got hand stuck under ladder  ? ?Problems Addressed: ?Contusion of left wrist, initial encounter: acute illness or injury ? ?Amount and/or Complexity of Data Reviewed ?Independent Historian: spouse ?   Details: Pt's wife reports she was with pt ?Radiology: ordered and independent interpretation performed. Decision-making details documented in ED Course. ?  Details: Xray  no wrist fracture ? ?Risk ?Risk Details: Pt wrapped in an ace wrap and advised ice and tylenol for pain ? ? ?Xray wrsit no fracture.  Pt placed in an ace wrap and advised ice to area of swelling.  Tylenol or ibuprofen for pain ? ? ? ? ? ? ? ?Final Clinical Impression(s) / ED Diagnoses ?Final diagnoses:  ?Contusion of left wrist, initial encounter  ? ? ?Rx / DC Orders ?ED Discharge Orders   ? ? None  ? ?  ? ?An After Visit Summary was printed and given to the patient.  ?  ?Fransico Meadow, PA-C ?05/22/21 2107 ? ?  ?Drenda Freeze, MD ?05/22/21 2308 ? ?

## 2021-06-04 ENCOUNTER — Encounter: Payer: Self-pay | Admitting: Internal Medicine

## 2021-06-07 MED ORDER — AMLODIPINE BESYLATE 10 MG PO TABS: 10.0000 mg | ORAL_TABLET | Freq: Every day | ORAL | 2 refills | Status: DC

## 2021-06-07 MED ORDER — SUMATRIPTAN SUCCINATE 50 MG PO TABS: ORAL_TABLET | ORAL | 2 refills | Status: DC

## 2021-06-07 NOTE — Telephone Encounter (Signed)
Okay to switch to amlodipine 10 mg. ?Okay to send in Imitrex 20 tablets and 2 refills ?

## 2021-11-12 ENCOUNTER — Telehealth: Payer: Self-pay | Admitting: Internal Medicine

## 2021-11-12 DIAGNOSIS — I10 Essential (primary) hypertension: Secondary | ICD-10-CM

## 2021-11-12 DIAGNOSIS — Z0000 Encounter for general adult medical examination without abnormal findings: Secondary | ICD-10-CM

## 2021-11-12 NOTE — Telephone Encounter (Signed)
Please advise 

## 2021-11-12 NOTE — Telephone Encounter (Signed)
Pt called wanting to have labs done prior to his scheduled CPE. Advised that a note would have to be sent back to get active orders prior to scheduling. Pt acknowledged understanding.

## 2021-11-15 NOTE — Telephone Encounter (Signed)
Please schedule lab appt several days prior to his CPX appt. Thank you.

## 2021-11-15 NOTE — Telephone Encounter (Signed)
CMP, FLP, CBC, TSH, PSA

## 2021-12-15 ENCOUNTER — Encounter: Payer: Self-pay | Admitting: Internal Medicine

## 2022-01-13 ENCOUNTER — Encounter: Payer: 59 | Admitting: Internal Medicine

## 2022-02-11 ENCOUNTER — Encounter: Payer: 59 | Admitting: Internal Medicine

## 2022-02-15 ENCOUNTER — Other Ambulatory Visit: Payer: 59

## 2022-02-15 ENCOUNTER — Encounter: Payer: Self-pay | Admitting: Internal Medicine

## 2022-02-15 ENCOUNTER — Ambulatory Visit (INDEPENDENT_AMBULATORY_CARE_PROVIDER_SITE_OTHER): Payer: 59 | Admitting: Internal Medicine

## 2022-02-15 VITALS — BP 128/82 | HR 71 | Temp 98.1°F | Resp 18 | Ht 76.0 in | Wt 239.4 lb

## 2022-02-15 DIAGNOSIS — Z0000 Encounter for general adult medical examination without abnormal findings: Secondary | ICD-10-CM

## 2022-02-15 DIAGNOSIS — I10 Essential (primary) hypertension: Secondary | ICD-10-CM

## 2022-02-15 DIAGNOSIS — Z8249 Family history of ischemic heart disease and other diseases of the circulatory system: Secondary | ICD-10-CM

## 2022-02-15 DIAGNOSIS — Z125 Encounter for screening for malignant neoplasm of prostate: Secondary | ICD-10-CM | POA: Diagnosis not present

## 2022-02-15 DIAGNOSIS — M545 Low back pain, unspecified: Secondary | ICD-10-CM | POA: Diagnosis not present

## 2022-02-15 DIAGNOSIS — G8929 Other chronic pain: Secondary | ICD-10-CM

## 2022-02-15 DIAGNOSIS — E785 Hyperlipidemia, unspecified: Secondary | ICD-10-CM

## 2022-02-15 LAB — CBC WITH DIFFERENTIAL/PLATELET
Basophils Absolute: 0 10*3/uL (ref 0.0–0.1)
Basophils Relative: 1.2 % (ref 0.0–3.0)
Eosinophils Absolute: 0.2 10*3/uL (ref 0.0–0.7)
Eosinophils Relative: 4.3 % (ref 0.0–5.0)
HCT: 44.9 % (ref 39.0–52.0)
Hemoglobin: 15.5 g/dL (ref 13.0–17.0)
Lymphocytes Relative: 30 % (ref 12.0–46.0)
Lymphs Abs: 1.1 10*3/uL (ref 0.7–4.0)
MCHC: 34.5 g/dL (ref 30.0–36.0)
MCV: 86.7 fl (ref 78.0–100.0)
Monocytes Absolute: 0.4 10*3/uL (ref 0.1–1.0)
Monocytes Relative: 10.7 % (ref 3.0–12.0)
Neutro Abs: 2 10*3/uL (ref 1.4–7.7)
Neutrophils Relative %: 53.8 % (ref 43.0–77.0)
Platelets: 300 10*3/uL (ref 150.0–400.0)
RBC: 5.19 Mil/uL (ref 4.22–5.81)
RDW: 13.1 % (ref 11.5–15.5)
WBC: 3.7 10*3/uL — ABNORMAL LOW (ref 4.0–10.5)

## 2022-02-15 LAB — COMPREHENSIVE METABOLIC PANEL
ALT: 25 U/L (ref 0–53)
AST: 25 U/L (ref 0–37)
Albumin: 4.5 g/dL (ref 3.5–5.2)
Alkaline Phosphatase: 51 U/L (ref 39–117)
BUN: 14 mg/dL (ref 6–23)
CO2: 30 mEq/L (ref 19–32)
Calcium: 9.5 mg/dL (ref 8.4–10.5)
Chloride: 103 mEq/L (ref 96–112)
Creatinine, Ser: 1.23 mg/dL (ref 0.40–1.50)
GFR: 66.86 mL/min (ref >60.00)
Glucose, Bld: 88 mg/dL (ref 70–99)
Potassium: 4.4 mEq/L (ref 3.5–5.1)
Sodium: 140 mEq/L (ref 135–145)
Total Bilirubin: 1 mg/dL (ref 0.2–1.2)
Total Protein: 7.1 g/dL (ref 6.0–8.3)

## 2022-02-15 LAB — LIPID PANEL
Cholesterol: 198 mg/dL (ref 0–200)
HDL: 43.8 mg/dL (ref >39.00)
LDL Cholesterol: 137 mg/dL — ABNORMAL HIGH (ref 0–99)
NonHDL: 154.02
Total CHOL/HDL Ratio: 5
Triglycerides: 83 mg/dL (ref 0.0–149.0)
VLDL: 16.6 mg/dL (ref 0.0–40.0)

## 2022-02-15 LAB — PSA: PSA: 1.64 ng/mL (ref 0.10–4.00)

## 2022-02-15 NOTE — Assessment & Plan Note (Signed)
Here for CPX HTN: On amlodipine, BP is normal, recommend to check ambulatory BPs.  Labs. OSA: Good compliance with CPAP. FH CAD: On aspirin, no symptoms, last LDL 129, we talk about the statins, he is undecided.  Offered a coronary calcium score, will proceed. Low back pain: Going on for several years, mild but persisting, request further eval.  Refer to sports medicine. Addendum: Occasional cough, no other symptom is, recommend office visit if not better in few days. RTC 1 year.  Sooner if needed.

## 2022-02-15 NOTE — Assessment & Plan Note (Signed)
-  Tdap 2018 - s/p Shingrix x2 - covid vax and  Flu shot UTD - CCS: cscope 02/2019, next 10 yeears per GI letter  -+FH Prostate ca: No symptoms, check PSA -Labs:CMP, FLP, CBC, PSA  -Diet exercise: Has decided to do better, improving his diet, trying to be more active. - Healthcare POA: See AVS

## 2022-02-15 NOTE — Patient Instructions (Addendum)
Check the  blood pressure regularly BP GOAL is between 110/65 and  135/85. If it is consistently higher or lower, let me know   Will refer you to sports medicine   GO TO THE LAB : Get the blood work     [place], [place] back for   a physical exam in 1 year, sooner if needed    "[place] of attorney" ,  "Living will" (Advance care planning documents)  If you already have a living will or healthcare power of attorney, is recommended you bring the copy to be scanned in your chart.   The document will be available to all the doctors you see in the system.  Advance care planning is a process that supports adults in  understanding and sharing their preferences regarding future medical care.  The patient's preferences are recorded in documents called Advance Directives and the can be modified at any time while the patient is in full mental capacity.   If you don't have one, please consider create one.      More information at: [email or web address]

## 2022-02-15 NOTE — Progress Notes (Signed)
Subjective:    Patient ID: Joe Can., male    DOB: Jun 22, 1970, 52 y.o.   MRN: [id number]  DOS:  02/15/2022 Type of visit - description: cpx  Here for CPX Reports chronic back pain, mostly with activity. Does not radiate, typically decreased with OTCs although responses usually not complete. Occasionally has lower extremity paresthesias, distal from the knees, more noticeable on the right.  Wt Readings from Last 3 Encounters:  02/15/22 239 lb 6 oz (108.6 kg)  01/06/21 239 lb 8 oz (108.6 kg)  04/15/20 235 lb (106.6 kg)     Review of Systems  Other than above, a 14 point review of systems is negative     Past Medical History:  Diagnosis Date   Allergy    Irregular heartbeat    Migraine    Ruptured lumbar disc    No surgery   Sleep apnea    on cpap   Syncope    w/u done in  [facility]    Past Surgical History:  Procedure Laterality Date   VASECTOMY  05/2013   Social History   Socioeconomic History   Marital status: Married    Spouse name: Not on file   Number of children: 2   Years of education: Not on file   Highest education level: Bachelor's degree (e.g., BA, AB, BS)  Occupational History   Occupation: works for the [employer]   Smoking status: Never   Smokeless tobacco: Never  Vaping Use   Vaping Use: Never used  Substance and Sexual Activity   Alcohol use: Yes    Comment: social-beer   Drug use: No   Sexual activity: Not on file  Other Topics Concern   Not on file  Social History Narrative   House hold: pt and wife  and a daughter    LOST HIS MOTHER 04/2017. His father lives at a ILF   Right handed   Caffeine: daily coffee 1 cup   Social Determinants of Health   Financial Resource Strain: Not on file  Food Insecurity: Not on file  Transportation Needs: Not on file  Physical Activity: Not on file  Stress: Not on file  Social Connections: Not on file  Intimate Partner Violence: Not on file     Current Outpatient  Medications  Medication Instructions   amLODipine (NORVASC) 10 mg, Oral, Daily   aspirin EC 81 mg, Oral, Daily, Swallow whole.   SUMAtriptan (IMITREX) 50 MG tablet TAKE 1 TABLET BY MOUTH WITH ONSET OF HEADACHE, MAY REPEAT IN 2 HOURS IF HEADACHE PERSIST       Objective:   Physical Exam BP 128/82   Pulse 71   Temp 98.1 F (36.7 C) (Oral)   Resp 18   Ht '6\' 4"'$  (1.93 m)   Wt 239 lb 6 oz (108.6 kg)   SpO2 97%   BMI 29.14 kg/m  General: Well developed, NAD, BMI noted Neck: No  thyromegaly  HEENT:  Normocephalic . Face symmetric, atraumatic Lungs:  CTA B Normal respiratory effort, no intercostal retractions, no accessory muscle use. Heart: RRR,  no murmur.  Abdomen:  Not distended, soft, non-tender. No rebound or rigidity. DRE: Normal sphincter tone, brown stools, normal prostate Lower extremities: no pretibial edema bilaterally  Skin: Exposed areas without rash. Not pale. Not jaundice Neurologic:  alert & oriented X3.  Speech normal, gait appropriate for age and unassisted Strength symmetric and appropriate for age.  Psych: Cognition and judgment appear intact.  Cooperative with normal attention span and concentration.  Behavior appropriate. No anxious or depressed appearing.     Assessment     Assessment HTN dx 09/2019  OSA dx 09-2020, +CPAP  ('@[facility]'$ ) H/o migraines H/o syncope, saw cards  ++ PVCs per holter 2015 + FH CAD  PLAN Here for CPX HTN: On amlodipine, BP is normal, recommend to check ambulatory BPs.  Labs. OSA: Good compliance with CPAP. FH CAD: On aspirin, no symptoms, last LDL 129, we talk about the statins, he is undecided.  Offered a coronary calcium score, will proceed. Low back pain: Going on for several years, mild but persisting, request further eval.  Refer to sports medicine. Addendum: Occasional cough, no other symptom is, recommend office visit if not better in few days. RTC 1 year.  Sooner if needed.

## 2022-02-20 ENCOUNTER — Encounter: Payer: Self-pay | Admitting: Internal Medicine

## 2022-02-21 ENCOUNTER — Other Ambulatory Visit (HOSPITAL_COMMUNITY): Payer: Self-pay | Admitting: Emergency Medicine

## 2022-02-21 ENCOUNTER — Encounter: Payer: Self-pay | Admitting: Internal Medicine

## 2022-02-21 DIAGNOSIS — I10 Essential (primary) hypertension: Secondary | ICD-10-CM

## 2022-02-21 MED ORDER — ATORVASTATIN CALCIUM 40 MG PO TABS: 40.0000 mg | ORAL_TABLET | Freq: Every day | ORAL | 0 refills | Status: DC

## 2022-02-21 NOTE — Telephone Encounter (Addendum)
Kim, See patient's message, the calcium coronary score does not belong to the patient. - Will contact radiology and medical records - Will have to find out for the actual patient is.

## 2022-02-21 NOTE — Addendum Note (Signed)
Addended byDamita Dunnings D on: 02/21/2022 07:51 AM   Modules accepted: Orders

## 2022-02-21 NOTE — Addendum Note (Signed)
Addended byDamita Dunnings D on: 02/21/2022 07:52 AM   Modules accepted: Orders

## 2022-02-21 NOTE — Telephone Encounter (Signed)
Spoke with Ambulatory Service Radiology Manager Sonia Side [facility]) regarding report.  Sonia Side states radiologist has been made aware, correct patient has been identified.  Report to be removed from chart by radiology team.

## 2022-02-23 ENCOUNTER — Encounter: Payer: Self-pay | Admitting: Family Medicine

## 2022-03-04 ENCOUNTER — Ambulatory Visit (HOSPITAL_BASED_OUTPATIENT_CLINIC_OR_DEPARTMENT_OTHER): Admission: RE | Admit: 2022-03-04 | Payer: 59 | Source: Ambulatory Visit

## 2022-03-04 ENCOUNTER — Ambulatory Visit (HOSPITAL_BASED_OUTPATIENT_CLINIC_OR_DEPARTMENT_OTHER)
Admission: RE | Admit: 2022-03-04 | Discharge: 2022-03-04 | Disposition: A | Discharge status: Home / Self Care | Payer: 59 | Source: Ambulatory Visit | Attending: Internal Medicine | Admitting: Internal Medicine

## 2022-03-04 ENCOUNTER — Other Ambulatory Visit (HOSPITAL_BASED_OUTPATIENT_CLINIC_OR_DEPARTMENT_OTHER): Payer: 59

## 2022-03-04 ENCOUNTER — Encounter (HOSPITAL_BASED_OUTPATIENT_CLINIC_OR_DEPARTMENT_OTHER): Payer: Self-pay

## 2022-03-04 DIAGNOSIS — I10 Essential (primary) hypertension: Secondary | ICD-10-CM

## 2022-03-09 ENCOUNTER — Other Ambulatory Visit: Payer: Self-pay

## 2022-04-10 ENCOUNTER — Other Ambulatory Visit: Payer: Self-pay | Admitting: Internal Medicine

## 2022-04-23 ENCOUNTER — Encounter: Payer: Self-pay | Admitting: Internal Medicine

## 2022-04-25 MED ORDER — ONDANSETRON 4 MG PO TBDP: 4.0000 mg | ORAL_TABLET | Freq: Three times a day (TID) | ORAL | 1 refills | Status: DC | PRN

## 2022-08-17 ENCOUNTER — Ambulatory Visit: Payer: 59 | Attending: Cardiology | Admitting: Cardiology

## 2022-08-17 ENCOUNTER — Encounter: Payer: Self-pay | Admitting: Cardiology

## 2022-08-17 VITALS — BP 146/94 | HR 67 | Ht 76.0 in | Wt 218.0 lb

## 2022-08-17 DIAGNOSIS — I10 Essential (primary) hypertension: Secondary | ICD-10-CM | POA: Diagnosis not present

## 2022-08-17 DIAGNOSIS — R55 Syncope and collapse: Secondary | ICD-10-CM

## 2022-08-17 DIAGNOSIS — E785 Hyperlipidemia, unspecified: Secondary | ICD-10-CM | POA: Diagnosis not present

## 2022-08-17 NOTE — Patient Instructions (Signed)
Medication Instructions:  The current medical regimen is effective;  continue present plan and medications.  *If you need a refill on your cardiac medications before your next appointment, please call your pharmacy*   Lab Work: Please have blood work today.  (Lipid)  If you have labs (blood work) drawn today and your tests are completely normal, you will receive your results only by: MyChart Message (if you have MyChart) OR A paper copy in the mail If you have any lab test that is abnormal or we need to change your treatment, we will call you to review the results.   Testing/Procedures: Your physician has requested that you have an echocardiogram. Echocardiography is a painless test that uses sound waves to create images of your heart. It provides your doctor with information about the size and shape of your heart and how well your heart's chambers and valves are working. This procedure takes approximately one hour. There are no restrictions for this procedure. Please do NOT wear cologne, perfume, aftershave, or lotions (deodorant is allowed). Please arrive 15 minutes prior to your appointment time.   Follow-Up: At [facility], you and your health needs are our priority.  As part of our continuing mission to provide you with exceptional heart care, we have created designated Provider Care Teams.  These Care Teams include your primary Cardiologist (physician) and Advanced Practice Providers (APPs -  Physician Assistants and Nurse Practitioners) who all work together to provide you with the care you need, when you need it.  We recommend signing up for the patient portal called "MyChart".  Sign up information is provided on this After Visit Summary.  MyChart is used to connect with patients for Virtual Visits (Telemedicine).  Patients are able to view lab/test results, encounter notes, upcoming appointments, etc.  Non-urgent messages can be sent to your provider as well.   To learn  more about what you can do with MyChart, go to [email or web address].    Your next appointment:   Follow up will be determined after the above testing has been completed.

## 2022-08-17 NOTE — Progress Notes (Signed)
Cardiology Office Note:    Date:  08/17/2022   ID:  Joe Leigh., DOB Aug 11, 1970, MRN [id number]  PCP:  Wanda Plump, MD   [facility] Providers Cardiologist:  None     Referring MD: Wanda Plump, MD    History of Present Illness:    Joe Torbeck. is a 52 y.o. male with coronary calcium score of 0 here for evaluation given family history (Mother and Father died with CAD, CHF (Father CAD, MI, enlarged heart, OSA), HTN) at the request of Dr. Drue Novel.  There was an error in reporting on 02/21/2022 that stated coronary calcium was present.  This was an error.  His coronary calcium score on 03/04/2022, imaging reviewed, showed no evidence of coronary calcification.  Has swelling in both ankles at times.  On 10 of amlodipine.  If get up at night will get near syncopal at times.  Blood pressures at home are in the 120 systolic.  Prior syncope due to stress with job, events would trigger. Once passed out in ER after morphine, "ants crawling on chest"  Occasional irreg heartbeat.  Previously has seen that has had every fifth beat PVC in the past.  Currently stable.  Down 20 pounds.   LDL 160, TSH 1.8. 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 Wife as well.  I took care of his wife in the hospital during MI.   Past Medical History:  Diagnosis Date   Allergy    Irregular heartbeat    Migraine    Ruptured lumbar disc    No surgery   Sleep apnea    on cpap   Syncope    w/u done in  [facility]    Past Surgical History:  Procedure Laterality Date   VASECTOMY  05/2013    Current Medications: Current Meds  Medication Sig   amLODipine (NORVASC) 10 MG tablet TAKE ONE TABLET BY MOUTH DAILY   aspirin EC 81 MG tablet Take 81 mg by mouth daily. Swallow whole.   ondansetron (ZOFRAN ODT) 4 MG disintegrating tablet Take 1 tablet (4 mg total) by mouth every 8 (eight) hours as needed for nausea or vomiting.   SUMAtriptan (IMITREX) 50 MG tablet TAKE 1 TABLET BY MOUTH WITH ONSET OF HEADACHE, MAY REPEAT IN 2  HOURS IF HEADACHE PERSIST     Allergies:   Hydrocodone and Nabumetone   Social History   Socioeconomic History   Marital status: Married    Spouse name: Not on file   Number of children: 2   Years of education: Not on file   Highest education level: Bachelor's degree (e.g., BA, AB, BS)  Occupational History   Occupation: works for the [employer]  Tobacco Use   Smoking status: Never   Smokeless tobacco: Never  Vaping Use   Vaping Use: Never used  Substance and Sexual Activity   Alcohol use: Yes    Comment: social-beer   Drug use: No   Sexual activity: Not on file  Other Topics Concern   Not on file  Social History Narrative   House hold: pt and wife  and a daughter    LOST HIS MOTHER 04/2017. His father lives at a ILF   Right handed   Caffeine: daily coffee 1 cup   Social Determinants of Health   Financial Resource Strain: Not on file  Food Insecurity: Not on file  Transportation Needs: Not on file  Physical Activity: Not on file  Stress: Not on file  Social Connections:  Not on file     Family History: The patient's family history includes Diabetes in his mother; Heart attack (age of onset: 63) in his father; Heart disease in his father; Heart failure in his father and mother; Hypertension in his mother; Kidney failure in his father and mother; Prostate cancer (age of onset: 33) in his brother. There is no history of Colon cancer, Migraines, Headache, Esophageal cancer, Rectal cancer, Stomach cancer, or Colon polyps.  ROS:   Please see the history of present illness.     All other systems reviewed and are negative.  EKGs/Labs/Other Studies Reviewed:    The following studies were reviewed today:  Coronary calcium score 03/04/2022-0.  Personally reviewed scan and interpreted.  The score is 0.  Prior EKG personally reviewed.  EKG: Sinus rhythm no other abnormalities  EKG Interpretation Date/Time:  Wednesday August 17 2022 09:07:13 EDT Ventricular Rate:  67 PR  Interval:  182 QRS Duration:  116 QT Interval:  396 QTC Calculation: 418 R Axis:   -22  Text Interpretation: Normal sinus rhythm Normal ECG No previous ECGs available Confirmed by Donato Schultz ([id number]) on 08/17/2022 10:10:27 AM   Recent Labs: 02/15/2022: ALT 25; BUN 14; Creatinine, Ser 1.23; Hemoglobin 15.5; Platelets 300.0; Potassium 4.4; Sodium 140  Recent Lipid Panel    Component Value Date/Time   CHOL 198 02/15/2022 0830   TRIG 83.0 02/15/2022 0830   HDL 43.80 02/15/2022 0830   CHOLHDL 5 02/15/2022 0830   VLDL 16.6 02/15/2022 0830   LDLCALC 137 (H) 02/15/2022 0830   LDLDIRECT 160.7 03/18/2010 1017     Risk Assessment/Calculations:              Physical Exam:    VS:  BP (!) 146/94   Pulse 67   Ht 6\' 4"  (1.93 m)   Wt 218 lb (98.9 kg)   SpO2 97%   BMI 26.54 kg/m     Wt Readings from Last 3 Encounters:  08/17/22 218 lb (98.9 kg)  02/15/22 239 lb 6 oz (108.6 kg)  01/06/21 239 lb 8 oz (108.6 kg)     GEN:  Well nourished, well developed in no acute distress HEENT: Normal NECK: No JVD; No carotid bruits LYMPHATICS: No lymphadenopathy CARDIAC: RRR, no murmurs, rubs, gallops RESPIRATORY:  Clear to auscultation without rales, wheezing or rhonchi  ABDOMEN: Soft, non-tender, non-distended MUSCULOSKELETAL:  No edema; No deformity  SKIN: Warm and dry NEUROLOGIC:  Alert and oriented x 3 PSYCHIATRIC:  Normal affect   ASSESSMENT:    1. Hypertension, unspecified type   2. Syncope, unspecified syncope type   3. Primary hypertension   4. 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  He is lost 20 pounds.  Working on his diet.  We will recheck his lipid panel today.  If LDL is greater than 100, I would advocate for him to be on Crestor 5 mg once a day.  With the LP(a) being elevated especially the strong family history as well,  aggressive prevention is preferred.  Thankfully calcium score was 0 however this is not negate the possibility of soft plaque present. -His daughter has been helping him with dietary prevention, motivation.  Excellent.  He has lost 20 pounds.  Syncope Hypertension - In the past has had syncopal episodes with stressful situations.  Thankfully he has not had any recently.  We will  check an echocardiogram to ensure proper structure and function.  He has stated that he had some ankle edema recently.  Remembers this with his parents.  This may actually be secondary to amlodipine.  With his well-controlled blood pressure, lets continue current dose for right now. -Symptoms of getting up in the melanite are likely secondary to orthostasis due to relaxed body during sleep.  Knows to be careful.           Medication Adjustments/Labs and Tests Ordered: Current medicines are reviewed at length with the patient today.  Concerns regarding medicines are outlined above.  Orders Placed This Encounter  Procedures   Lipid panel   EKG 12-Lead   ECHOCARDIOGRAM COMPLETE   No orders of the defined types were placed in this encounter.   Patient Instructions  Medication Instructions:  The current medical regimen is effective;  continue present plan and medications.  *If you need a refill on your cardiac medications before your next appointment, please call your pharmacy*   Lab Work: Please have blood work today.  (Lipid)  If you have labs (blood work) drawn today and your tests are completely normal, you will receive your results only by: MyChart Message (if you have MyChart) OR A paper copy in the mail If you have any lab test that is abnormal or we need to change your treatment, we will call you to review the results.   Testing/Procedures: Your physician has requested that you have an echocardiogram. Echocardiography is a painless test that uses sound waves to create images of your heart. It  provides your doctor with information about the size and shape of your heart and how well your heart's chambers and valves are working. This procedure takes approximately one hour. There are no restrictions for this procedure. Please do NOT wear cologne, perfume, aftershave, or lotions (deodorant is allowed). Please arrive 15 minutes prior to your appointment time.   Follow-Up: At [facility], you and your health needs are our priority.  As part of our continuing mission to provide you with exceptional heart care, we have created designated Provider Care Teams.  These Care Teams include your primary Cardiologist (physician) and Advanced Practice Providers (APPs -  Physician Assistants and Nurse Practitioners) who all work together to provide you with the care you need, when you need it.  We recommend signing up for the patient portal called "MyChart".  Sign up information is provided on this After Visit Summary.  MyChart is used to connect with patients for Virtual Visits (Telemedicine).  Patients are able to view lab/test results, encounter notes, upcoming appointments, etc.  Non-urgent messages can be sent to your provider as well.   To learn more about what you can do with MyChart, go to [email or web address].    Your next appointment:   Follow up will be determined after the above testing has been completed.    Signed, Donato Schultz, MD  08/17/2022 10:11 AM    [facility]

## 2022-08-18 LAB — LIPID PANEL
Chol/HDL Ratio: 3.8 ratio (ref 0.0–5.0)
Cholesterol, Total: 194 mg/dL (ref 100–199)
HDL: 51 mg/dL (ref >39)
LDL Chol Calc (NIH): 122 mg/dL — ABNORMAL HIGH (ref 0–99)
Triglycerides: 115 mg/dL (ref 0–149)
VLDL Cholesterol Cal: 21 mg/dL (ref 5–40)

## 2022-08-19 ENCOUNTER — Telehealth: Payer: Self-pay | Admitting: Cardiology

## 2022-08-19 ENCOUNTER — Other Ambulatory Visit: Payer: Self-pay

## 2022-08-19 DIAGNOSIS — E785 Hyperlipidemia, unspecified: Secondary | ICD-10-CM

## 2022-08-19 MED ORDER — ROSUVASTATIN CALCIUM 5 MG PO TABS: 5.0000 mg | ORAL_TABLET | Freq: Every day | ORAL | 3 refills | Status: DC

## 2022-08-19 NOTE — Telephone Encounter (Signed)
  Jake Bathe, MD 08/18/2022  6:52 AM EDT     LDL 122 Would like to see < 100 with elevated Lp(a) and family history   Let's try Crestor 5mg  once a day and repeat lipids in 2 months (in addition to diet and exercise)   The patient has been notified of the result and verbalized understanding.  All questions (if any) were answered. Frutoso Schatz, RN 08/19/2022 11:55 AM

## 2022-08-19 NOTE — Telephone Encounter (Signed)
Patient was returning call for results. Please advise °

## 2022-09-09 ENCOUNTER — Ambulatory Visit (HOSPITAL_COMMUNITY): Payer: 59 | Attending: Cardiology

## 2022-09-09 DIAGNOSIS — R55 Syncope and collapse: Secondary | ICD-10-CM | POA: Insufficient documentation

## 2022-09-09 LAB — ECHOCARDIOGRAM COMPLETE
Area-P 1/2: 3.08 cm2
S' Lateral: 3.2 cm

## 2022-10-04 IMAGING — DX DG WRIST COMPLETE 3+V*L*
4 series · 4 of 4 positions shown · non-contrast
Comparison: None.

CLINICAL DATA: wrist injury

EXAM:
LEFT WRIST - COMPLETE 3+ VIEW

[wrist ap (1 of 2)]
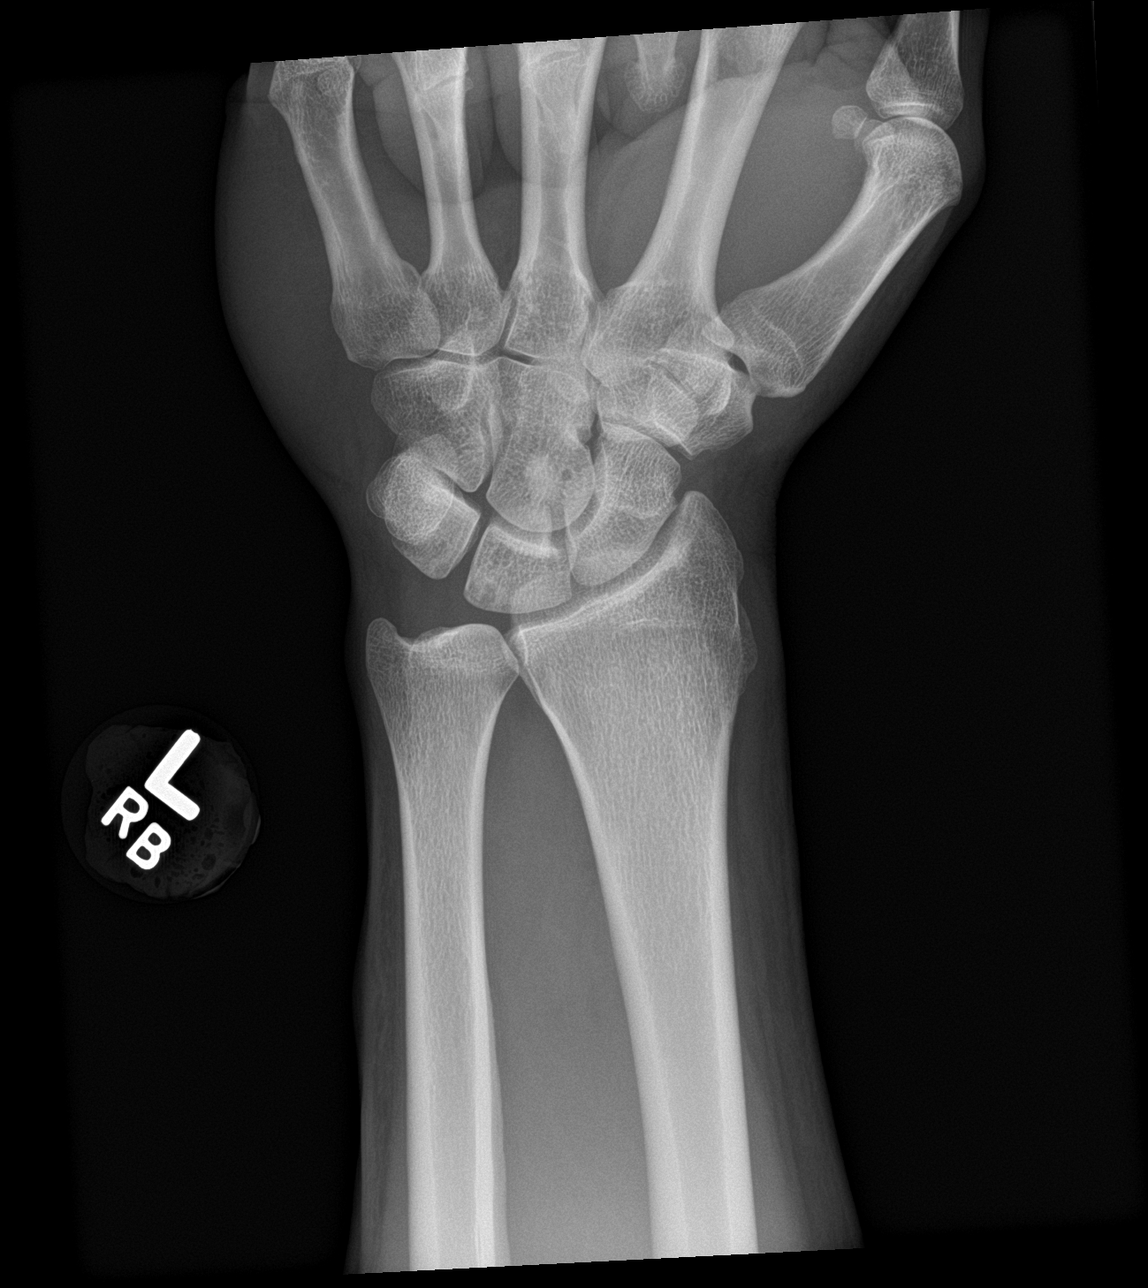

[wrist obl]
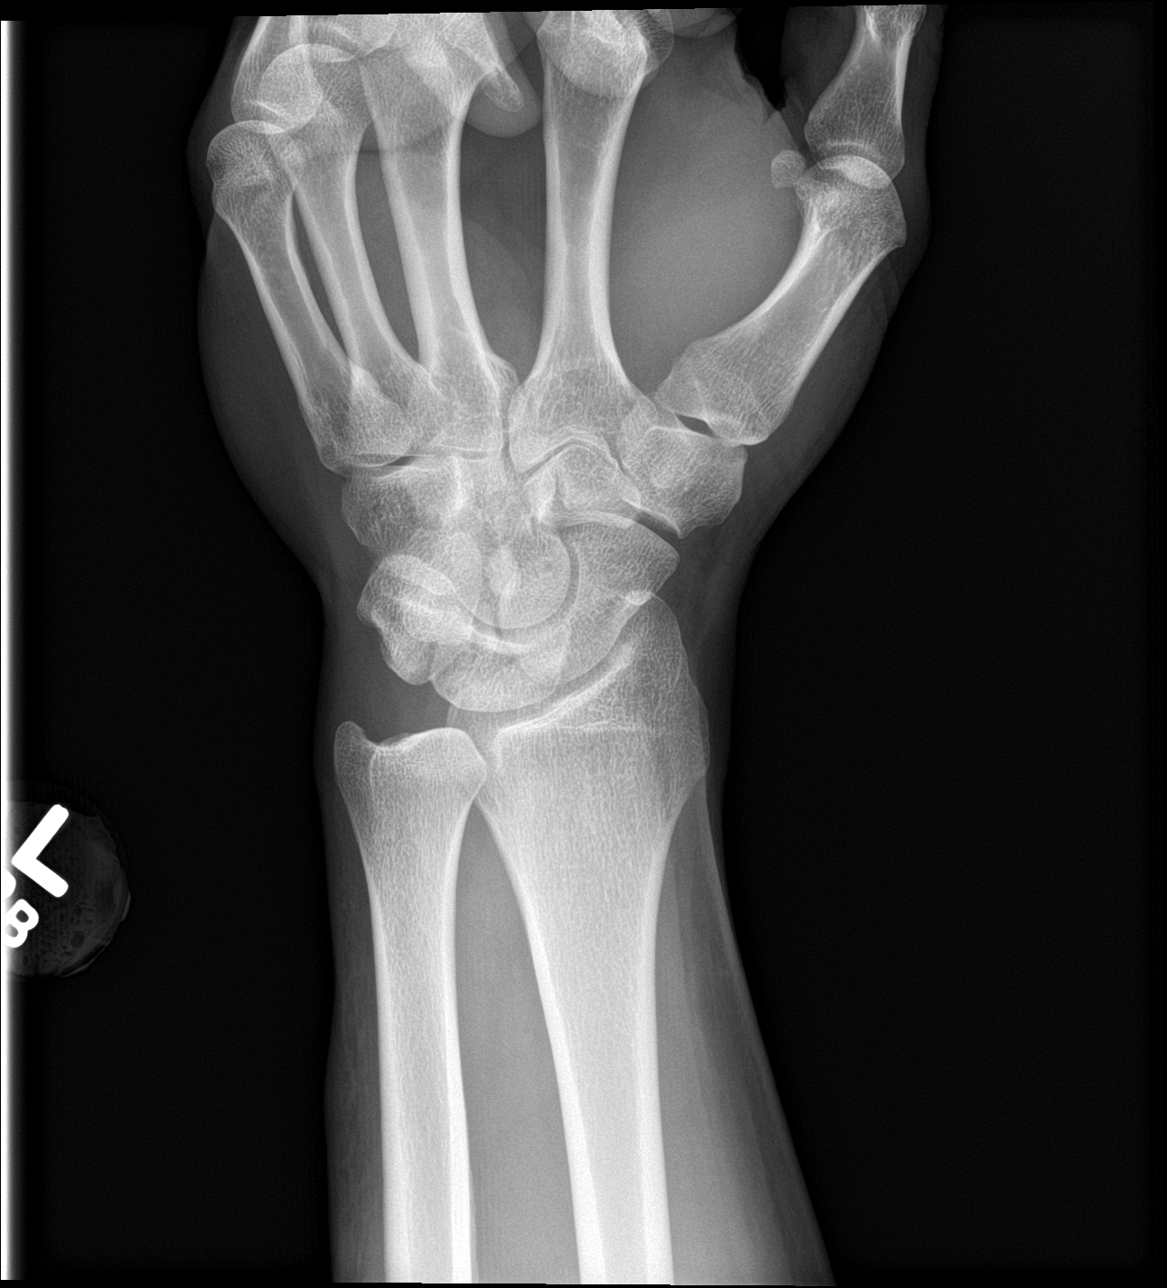

[wrist tunnel]
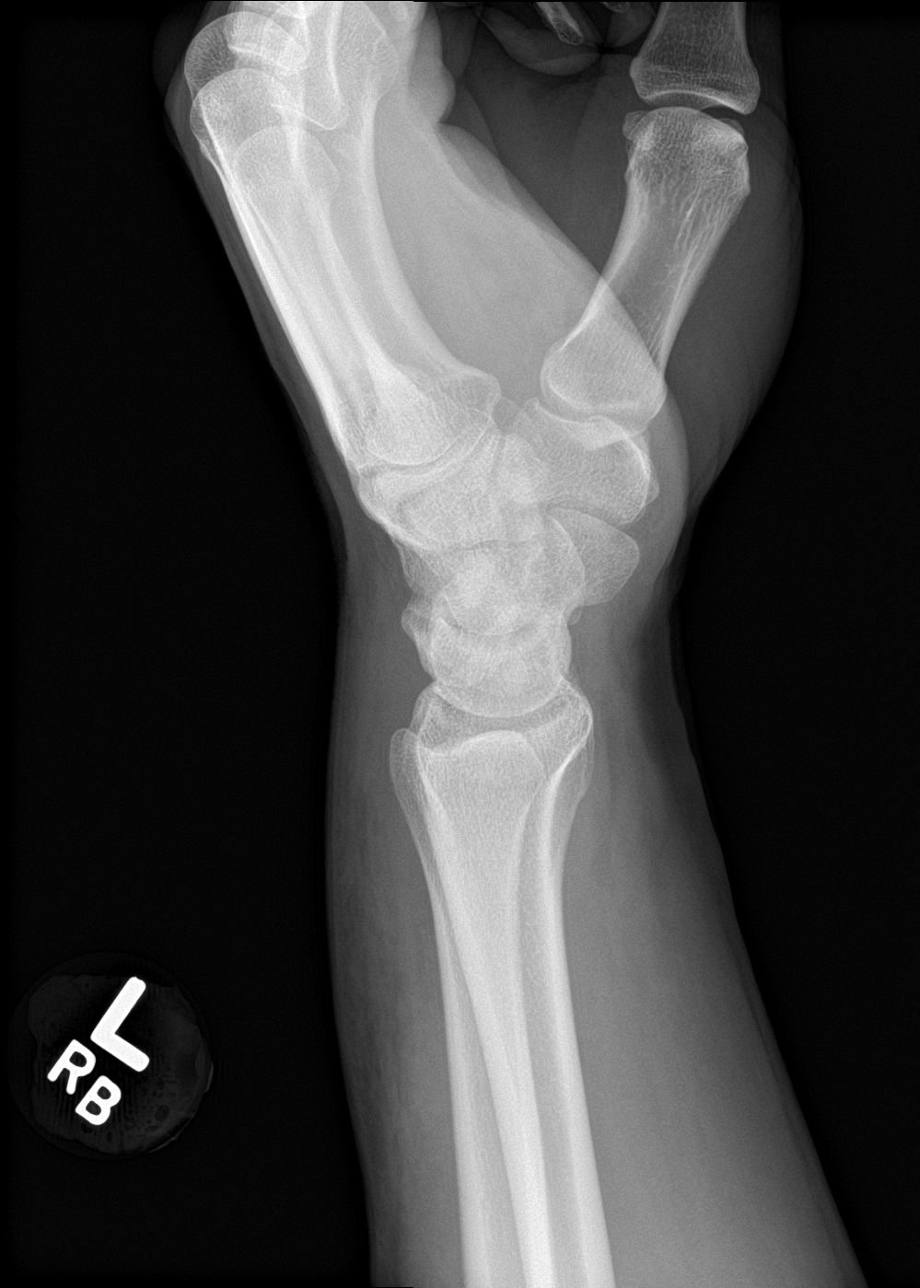

[wrist ap (2 of 2)]
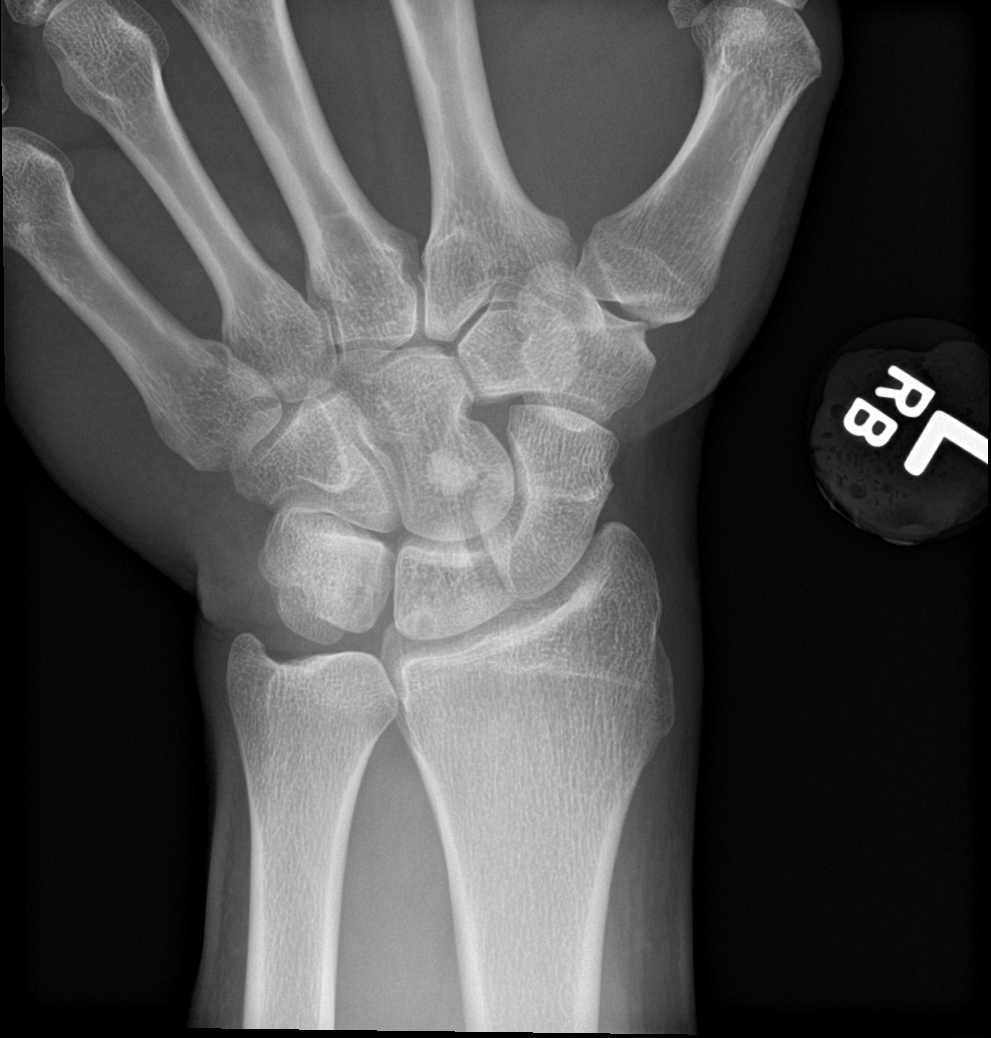

[4 of 4 positions shown; findings below may reference images not displayed]

FINDINGS: There is no evidence of fracture or dislocation. Dense sclerotic
lesion within the capitate likely representing a bone island. There
is no evidence of arthropathy or other focal bone abnormality. Soft
tissues are unremarkable.
IMPRESSION: No acute displaced fracture or dislocation.

## 2022-10-13 NOTE — Progress Notes (Signed)
Joe Castro D.[facility] [address] [place] [address] Phone: [phone]   Assessment and Plan:     1. Chronic bilateral low back pain without sciatica 2. DDD (degenerative disc disease), lumbar -Chronic with exacerbation, initial sports medicine visit - Recurrent flare of low back pain most consistent with exacerbation at L5-S1 with degenerative changes seen on x-ray - X-ray obtained in clinic.  My interpretation: No acute fracture or dislocation.  Mild dextroscoliosis and lumbar spine.  DDD at L5-S1 - Start meloxicam 15 mg daily x2 weeks.  If still having pain after 2 weeks, complete 3rd-week of meloxicam. May use remaining meloxicam as needed once daily for pain control.  Do not to use additional NSAIDs while taking meloxicam.  May use Tylenol [phone] mg 2 to 3 times a day for breakthrough pain. - Start HEP for low back  15 additional minutes spent for educating Therapeutic Home Exercise Program.  This included exercises focusing on stretching, strengthening, with focus on eccentric aspects.   Long term goals include an improvement in range of motion, strength, endurance as well as avoiding reinjury. Patient's frequency would include in 1-2 times a day, 3-5 times a week for a duration of 6-12 weeks. Proper technique shown and discussed handout in great detail with ATC.  All questions were discussed and answered.    Other orders - meloxicam (MOBIC) 15 MG tablet; Take 1 tablet (15 mg total) by mouth daily.    Pertinent previous records reviewed include none   Follow Up: 4 weeks for reevaluation.  OMT discussed at today's visit and could be considered at follow-up.  Could also consider physical therapy if no improvement   Subjective:   I, Joe Castro, am serving as a Neurosurgeon for Doctor Richardean Sale  Chief Complaint: low back pain   HPI:   10/14/2022 Patient is a 52 year old male complaining of low back pain. Patient states that  he has a ruptured disc this happened 20 years ago. Hasn't had a lot of pain up until the last couple of years. He isnt able to be as active in the yard. Pain radiates to his thoracic back. Advil had been working but not as much now . No numbness or tingling. Pain with standing and stooped over.   Relevant Historical Information: Hypertension  Additional pertinent review of systems negative.   Current Outpatient Medications:    amLODipine (NORVASC) 10 MG tablet, TAKE ONE TABLET BY MOUTH DAILY, Disp: 90 tablet, Rfl: 3   aspirin EC 81 MG tablet, Take 81 mg by mouth daily. Swallow whole., Disp: , Rfl:    meloxicam (MOBIC) 15 MG tablet, Take 1 tablet (15 mg total) by mouth daily., Disp: 30 tablet, Rfl: 0   ondansetron (ZOFRAN ODT) 4 MG disintegrating tablet, Take 1 tablet (4 mg total) by mouth every 8 (eight) hours as needed for nausea or vomiting., Disp: 30 tablet, Rfl: 1   rosuvastatin (CRESTOR) 5 MG tablet, Take 1 tablet (5 mg total) by mouth daily., Disp: 90 tablet, Rfl: 3   SUMAtriptan (IMITREX) 50 MG tablet, TAKE 1 TABLET BY MOUTH WITH ONSET OF HEADACHE, MAY REPEAT IN 2 HOURS IF HEADACHE PERSIST, Disp: 20 tablet, Rfl: 2   Objective:     Vitals:   10/14/22 0903  BP: 120/78  Pulse: 69  SpO2: 97%  Weight: 218 lb (98.9 kg)  Height: 6\' 4"  (1.93 m)      Body mass index is 26.54 kg/m.  Physical Exam:    Gen: Appears well, nad, nontoxic and pleasant Psych: Alert and oriented, appropriate mood and affect Neuro: sensation intact, strength is 5/5 in upper and lower extremities, muscle tone wnl Skin: no susupicious lesions or rashes  Back - Normal skin, Spine with normal alignment and no deformity.   No tenderness to vertebral process palpation.   Paraspinous muscles are not tender and without spasm NTTP gluteal musculature Straight leg raise negative Trendelenberg negative Piriformis Test negative Gait normal  Low back pain flared bilaterally with lumbar  extension  Electronically signed by:  Joe Castro D.[facility] 9:44 AM 10/14/22

## 2022-10-14 ENCOUNTER — Ambulatory Visit (INDEPENDENT_AMBULATORY_CARE_PROVIDER_SITE_OTHER): Payer: 59

## 2022-10-14 ENCOUNTER — Ambulatory Visit: Payer: 59 | Admitting: Sports Medicine

## 2022-10-14 VITALS — BP 120/78 | HR 69 | Ht 76.0 in | Wt 218.0 lb

## 2022-10-14 DIAGNOSIS — G8929 Other chronic pain: Secondary | ICD-10-CM | POA: Diagnosis not present

## 2022-10-14 DIAGNOSIS — M545 Low back pain, unspecified: Secondary | ICD-10-CM

## 2022-10-14 DIAGNOSIS — M5136 Other intervertebral disc degeneration, lumbar region: Secondary | ICD-10-CM | POA: Diagnosis not present

## 2022-10-14 MED ORDER — MELOXICAM 15 MG PO TABS: 15.0000 mg | ORAL_TABLET | Freq: Every day | ORAL | 0 refills | Status: DC

## 2022-10-14 NOTE — Patient Instructions (Signed)
- 

## 2022-10-19 ENCOUNTER — Encounter: Payer: Self-pay | Admitting: Cardiology

## 2022-10-20 ENCOUNTER — Encounter: Payer: Self-pay | Admitting: Internal Medicine

## 2022-10-21 NOTE — Progress Notes (Signed)
Received a call from scheduling team that pt is at [facility] and office can not see orders.  New orders added for FLP and ALT and released for [facility] to see.

## 2022-10-22 LAB — LIPID PANEL
Chol/HDL Ratio: 2.9 ratio (ref 0.0–5.0)
Cholesterol, Total: 157 mg/dL (ref 100–199)
HDL: 55 mg/dL (ref >39)
LDL Chol Calc (NIH): 86 mg/dL (ref 0–99)
Triglycerides: 84 mg/dL (ref 0–149)
VLDL Cholesterol Cal: 16 mg/dL (ref 5–40)

## 2022-11-17 NOTE — Progress Notes (Signed)
Aleen Sells D.[facility] [address] [place] [address] Phone: [phone]   Assessment and Plan:     1. Chronic bilateral low back pain without sciatica 2. Degeneration of intervertebral disc of lumbar region with discogenic back pain  -Chronic with exacerbation, subsequent visit - Overall improvement in low back pain that was most consistent with exacerbation of L5-S1 degenerative changes while patient was taking meloxicam daily, however pain has returned since discontinuing regular use - Start Tylenol as needed for day-to-day pain relief - Discontinue daily meloxicam and use remainder as needed for breakthrough pain.  Recommend using chronic NSAIDs no more frequently than once per week - Increase frequency of HEP.  Can be performed after workouts  Pertinent previous records reviewed include none   Follow Up: As needed if no improvement or worsening of symptoms.  Could further discuss OMT versus physical therapy versus advanced imaging   Subjective:   I, Moenique Parris, am serving as a Neurosurgeon for Doctor Richardean Sale   Chief Complaint: low back pain    HPI:    10/14/2022 Patient is a 52 year old male complaining of low back pain. Patient states that he has a ruptured disc this happened 20 years ago. Hasn't had a lot of pain up until the last couple of years. He isnt able to be as active in the yard. Pain radiates to his thoracic back. Advil had been working but not as much now . No numbness or tingling. Pain with standing and stooped over.   11/18/2022 Patient states that he is okay . Meloxicam helped, but he is starting to notice the back pain coming back    Relevant Historical Information: Hypertension  Additional pertinent review of systems negative.   Current Outpatient Medications:    amLODipine (NORVASC) 10 MG tablet, TAKE ONE TABLET BY MOUTH DAILY, Disp: 90 tablet, Rfl: 3   aspirin EC 81 MG tablet, Take 81 mg by mouth  daily. Swallow whole., Disp: , Rfl:    meloxicam (MOBIC) 15 MG tablet, Take 1 tablet (15 mg total) by mouth daily., Disp: 30 tablet, Rfl: 0   ondansetron (ZOFRAN ODT) 4 MG disintegrating tablet, Take 1 tablet (4 mg total) by mouth every 8 (eight) hours as needed for nausea or vomiting., Disp: 30 tablet, Rfl: 1   SUMAtriptan (IMITREX) 50 MG tablet, TAKE 1 TABLET BY MOUTH WITH ONSET OF HEADACHE, MAY REPEAT IN 2 HOURS IF HEADACHE PERSIST, Disp: 20 tablet, Rfl: 2   rosuvastatin (CRESTOR) 5 MG tablet, Take 1 tablet (5 mg total) by mouth daily., Disp: 90 tablet, Rfl: 3   Objective:     Vitals:   11/18/22 0758  BP: 120/78  Pulse: 71  SpO2: 97%  Weight: 218 lb (98.9 kg)  Height: 6\' 4"  (1.93 m)      Body mass index is 26.54 kg/m.    Physical Exam:    Gen: Appears well, nad, nontoxic and pleasant Psych: Alert and oriented, appropriate mood and affect Neuro: sensation intact, strength is 5/5 in upper and lower extremities, muscle tone wnl Skin: no susupicious lesions or rashes   Back - Normal skin, Spine with normal alignment and no deformity.   No tenderness to vertebral process palpation.   Paraspinous muscles are not tender and without spasm NTTP gluteal musculature Straight leg raise negative Trendelenberg negative Piriformis Test negative Gait normal  Low back pain flared bilaterally with lumbar extension    Electronically signed by:  Aleen Sells D.O.  [facility] 8:19 AM 11/18/22

## 2022-11-18 ENCOUNTER — Ambulatory Visit: Payer: 59 | Admitting: Sports Medicine

## 2022-11-18 VITALS — BP 120/78 | HR 71 | Ht 76.0 in | Wt 218.0 lb

## 2022-11-18 DIAGNOSIS — M5136 Other intervertebral disc degeneration, lumbar region with discogenic back pain only: Secondary | ICD-10-CM | POA: Diagnosis not present

## 2022-11-18 DIAGNOSIS — M545 Low back pain, unspecified: Secondary | ICD-10-CM

## 2022-11-18 DIAGNOSIS — G8929 Other chronic pain: Secondary | ICD-10-CM

## 2022-11-18 NOTE — Patient Instructions (Signed)
Tylenol as needed for day to day pain relief May use meloxicam as needed for breakthrough pain recommend using it not more than 1 time per week  Recommend HEP especially after workouts As needed follow up

## 2023-02-20 ENCOUNTER — Ambulatory Visit (INDEPENDENT_AMBULATORY_CARE_PROVIDER_SITE_OTHER): Payer: 59 | Admitting: Internal Medicine

## 2023-02-20 ENCOUNTER — Encounter: Payer: Self-pay | Admitting: Internal Medicine

## 2023-02-20 VITALS — BP 126/80 | HR 68 | Temp 98.0°F | Resp 16 | Ht 76.0 in | Wt 226.4 lb

## 2023-02-20 DIAGNOSIS — R399 Unspecified symptoms and signs involving the genitourinary system: Secondary | ICD-10-CM

## 2023-02-20 DIAGNOSIS — Z0000 Encounter for general adult medical examination without abnormal findings: Secondary | ICD-10-CM

## 2023-02-20 DIAGNOSIS — I10 Essential (primary) hypertension: Secondary | ICD-10-CM | POA: Diagnosis not present

## 2023-02-20 DIAGNOSIS — E785 Hyperlipidemia, unspecified: Secondary | ICD-10-CM

## 2023-02-20 LAB — CBC WITH DIFFERENTIAL/PLATELET
Basophils Absolute: 0.1 10*3/uL (ref 0.0–0.1)
Basophils Relative: 2 % (ref 0.0–3.0)
Eosinophils Absolute: 0.2 10*3/uL (ref 0.0–0.7)
Eosinophils Relative: 5.2 % — ABNORMAL HIGH (ref 0.0–5.0)
HCT: 44.1 % (ref 39.0–52.0)
Hemoglobin: 15.3 g/dL (ref 13.0–17.0)
Lymphocytes Relative: 29 % (ref 12.0–46.0)
Lymphs Abs: 1 10*3/uL (ref 0.7–4.0)
MCHC: 34.6 g/dL (ref 30.0–36.0)
MCV: 89.5 fL (ref 78.0–100.0)
Monocytes Absolute: 0.3 10*3/uL (ref 0.1–1.0)
Monocytes Relative: 8.6 % (ref 3.0–12.0)
Neutro Abs: 1.9 10*3/uL (ref 1.4–7.7)
Neutrophils Relative %: 55.2 % (ref 43.0–77.0)
Platelets: 287 10*3/uL (ref 150.0–400.0)
RBC: 4.93 Mil/uL (ref 4.22–5.81)
RDW: 12.8 % (ref 11.5–15.5)
WBC: 3.5 10*3/uL — ABNORMAL LOW (ref 4.0–10.5)

## 2023-02-20 LAB — COMPREHENSIVE METABOLIC PANEL
ALT: 26 U/L (ref 0–53)
AST: 24 U/L (ref 0–37)
Albumin: 4.5 g/dL (ref 3.5–5.2)
Alkaline Phosphatase: 53 U/L (ref 39–117)
BUN: 17 mg/dL (ref 6–23)
CO2: 31 meq/L (ref 19–32)
Calcium: 9.2 mg/dL (ref 8.4–10.5)
Chloride: 103 meq/L (ref 96–112)
Creatinine, Ser: 1.13 mg/dL (ref 0.40–1.50)
GFR: 73.49 mL/min (ref >60.00)
Glucose, Bld: 89 mg/dL (ref 70–99)
Potassium: 4.2 meq/L (ref 3.5–5.1)
Sodium: 138 meq/L (ref 135–145)
Total Bilirubin: 0.8 mg/dL (ref 0.2–1.2)
Total Protein: 6.9 g/dL (ref 6.0–8.3)

## 2023-02-20 LAB — URINALYSIS, ROUTINE W REFLEX MICROSCOPIC
Bilirubin Urine: NEGATIVE
Hgb urine dipstick: NEGATIVE
Ketones, ur: NEGATIVE
Leukocytes,Ua: NEGATIVE
Nitrite: NEGATIVE
RBC / HPF: NONE SEEN (ref 0–?)
Specific Gravity, Urine: 1.015 (ref 1.000–1.030)
Total Protein, Urine: NEGATIVE
Urine Glucose: NEGATIVE
Urobilinogen, UA: 0.2 (ref 0.0–1.0)
pH: 6.5 (ref 5.0–8.0)

## 2023-02-20 LAB — PSA: PSA: 1.18 ng/mL (ref 0.10–4.00)

## 2023-02-20 NOTE — Progress Notes (Signed)
 Subjective:    Patient ID: Joe Castro., male    DOB: October 31, 1970, 53 y.o.   MRN: [id number]  DOS:  02/20/2023 Type of visit - description: cpx  CPX. Good med compliance. Reports occasional right shoulder pain, under the deltoid area, sometimes at night. For the last few months have reported unusual urine color (greenish) and smell.  No dysuria or gross hematuria  Review of Systems  Other than above, a 14 point review of systems is negative    Past Medical History:  Diagnosis Date   Allergy    Irregular heartbeat    Migraine    Ruptured lumbar disc    No surgery   Sleep apnea    on cpap   Syncope    w/u done in  [facility]    Past Surgical History:  Procedure Laterality Date   VASECTOMY  05/2013    Current Outpatient Medications  Medication Instructions   amLODipine  (NORVASC ) 10 mg, Oral, Daily   aspirin EC 81 mg, Daily   finasteride (PROPECIA) 1 mg, Daily   meloxicam  (MOBIC ) 15 mg, Oral, Daily   ondansetron  (ZOFRAN  ODT) 4 mg, Oral, Every 8 hours PRN   rosuvastatin  (CRESTOR ) 5 mg, Oral, Daily   SUMAtriptan  (IMITREX ) 50 MG tablet TAKE 1 TABLET BY MOUTH WITH ONSET OF HEADACHE, MAY REPEAT IN 2 HOURS IF HEADACHE PERSIST       Objective:   Physical Exam BP 126/80   Pulse 68   Temp 98 F (36.7 C) (Oral)   Resp 16   Ht 6' 4 (1.93 m)   Wt 226 lb 6 oz (102.7 kg)   SpO2 98%   BMI 27.56 kg/m  General: Well developed, NAD, BMI noted Neck: No  thyromegaly  HEENT:  Normocephalic . Face symmetric, atraumatic MSK: Shoulders symmetric, ROM normal bilaterally Lungs:  CTA B Normal respiratory effort, no intercostal retractions, no accessory muscle use. Heart: RRR,  no murmur.  Abdomen:  Not distended, soft, non-tender. No rebound or rigidity.   Lower extremities: no pretibial edema bilaterally DRE: Normal sphincter tone, no stools, prostate normal. Skin: Exposed areas without rash. Not pale. Not jaundice Neurologic:  alert & oriented X3.  Speech normal, gait  appropriate for age and unassisted Strength symmetric and appropriate for age.  Psych: Cognition and judgment appear intact.  Cooperative with normal attention span and concentration.  Behavior appropriate. No anxious or depressed appearing.     Assessment    Assessment HTN dx 09/2019  OSA dx 09-2020, +CPAP  (@[facility] ) H/o migraines H/o syncope, saw cards  ++ PVCs per holter 2015 + FH CAD: 03/04/2022: Coronary calcium  score 0 (there is a previous report enered in error)  PLAN Here for CPX -Tdap 2018 - s/p Shingrix x2 - had a recent covid vax and  Flu shot   - CCS: cscope 02/2019, next 10 years per GI letter  -+FH Prostate ca: No sxs, DRE negative, check PSA. -Labs:CMP CBC PSA UA urine culture -Diet exercise: Encouraged a healthy lifestyle . HTN: On amlodipine , BP is very good, checking labs. OSA: On CPAP, reports good compliance. Low back pain: Better after he took meloxicam , now takes prn Right shoulder pain: Recommend meloxicam  as needed,w/ GI precautions, see sports medicine if needed. Hyperlipidemia: Due to FH of CAD, a coronary calcium 's score was done  >>>  0, cardiology recommended   Crestor  5 mg qd. Last LDL 86 (from 122) no change. LUTs pt reports an unusual urine color and smell, check UA urine  culture. RTC 6 months

## 2023-02-20 NOTE — Assessment & Plan Note (Signed)
 Here for CPX -Tdap 2018 - s/p Shingrix x2 - had a recent covid vax and  Flu shot   - CCS: cscope 02/2019, next 10 years per GI letter  -+FH Prostate ca: No sxs, DRE negative, check PSA. -Labs:CMP CBC PSA UA urine culture -Diet exercise: Encouraged a healthy lifestyle .

## 2023-02-20 NOTE — Patient Instructions (Addendum)
  Check the  blood pressure regularly Blood pressure goal:  between 110/65 and  135/85. If it is consistently higher or lower, let me know     GO TO THE LAB : Get the blood work     Next visit with me in 6 months Please schedule it at the front desk        "Health Care Power of attorney" ,  "Living will" (Advance care planning documents)  If you already have a living will or healthcare power of attorney, is recommended you bring the copy to be scanned in your chart.   The document will be available to all the doctors you see in the system.  Advance care planning is a process that supports adults in  understanding and sharing their preferences regarding future medical care.  The patient's preferences are recorded in documents called Advance Directives and the can be modified at any time while the patient is in full mental capacity.   If you don't have one, please consider create one.      More information at: [email or web address]

## 2023-02-20 NOTE — Assessment & Plan Note (Signed)
 Here for CPX  HTN: On amlodipine , BP is very good, checking labs. OSA: On CPAP, reports good compliance. Low back pain: Better after he took meloxicam , now takes prn Right shoulder pain: Recommend meloxicam  as needed,w/ GI precautions, see sports medicine if needed. Hyperlipidemia: Due to FH of CAD, a coronary calcium 's score was done  >>>  0, cardiology recommended   Crestor  5 mg qd. Last LDL 86 (from 122) no change. LUTs pt reports an unusual urine color and smell, check UA urine culture. RTC 6 months

## 2023-02-21 LAB — URINE CULTURE
MICRO NUMBER:: 15946688
Result:: NO GROWTH
SPECIMEN QUALITY:: ADEQUATE

## 2023-06-18 ENCOUNTER — Other Ambulatory Visit: Payer: Self-pay | Admitting: Sports Medicine

## 2023-06-18 ENCOUNTER — Encounter: Payer: Self-pay | Admitting: Internal Medicine

## 2023-06-19 MED ORDER — AMLODIPINE BESYLATE 10 MG PO TABS: 10.0000 mg | ORAL_TABLET | Freq: Every day | ORAL | 1 refills | Status: DC

## 2023-06-19 MED ORDER — MELOXICAM 15 MG PO TABS: 15.0000 mg | ORAL_TABLET | Freq: Every day | ORAL | 0 refills | Status: AC | PRN

## 2023-08-10 ENCOUNTER — Other Ambulatory Visit: Payer: Self-pay

## 2023-08-10 MED ORDER — ROSUVASTATIN CALCIUM 5 MG PO TABS: 5.0000 mg | ORAL_TABLET | Freq: Every day | ORAL | 0 refills | Status: DC

## 2023-08-25 ENCOUNTER — Encounter: Payer: Self-pay | Admitting: Internal Medicine

## 2023-08-25 ENCOUNTER — Ambulatory Visit: Payer: 59 | Admitting: Internal Medicine

## 2023-08-25 ENCOUNTER — Other Ambulatory Visit (HOSPITAL_BASED_OUTPATIENT_CLINIC_OR_DEPARTMENT_OTHER): Payer: Self-pay

## 2023-08-25 VITALS — BP 132/76 | HR 72 | Temp 97.8°F | Resp 16 | Ht 76.0 in | Wt 225.1 lb

## 2023-08-25 DIAGNOSIS — M25511 Pain in right shoulder: Secondary | ICD-10-CM

## 2023-08-25 DIAGNOSIS — N529 Male erectile dysfunction, unspecified: Secondary | ICD-10-CM

## 2023-08-25 DIAGNOSIS — E785 Hyperlipidemia, unspecified: Secondary | ICD-10-CM | POA: Diagnosis not present

## 2023-08-25 DIAGNOSIS — I10 Essential (primary) hypertension: Secondary | ICD-10-CM

## 2023-08-25 LAB — LIPID PANEL
Cholesterol: 156 mg/dL (ref 0–200)
HDL: 51.4 mg/dL (ref >39.00)
LDL Cholesterol: 82 mg/dL (ref 0–99)
NonHDL: 104.83
Total CHOL/HDL Ratio: 3
Triglycerides: 114 mg/dL (ref 0.0–149.0)
VLDL: 22.8 mg/dL (ref 0.0–40.0)

## 2023-08-25 MED ORDER — SILDENAFIL CITRATE 20 MG PO TABS: 80.0000 mg | ORAL_TABLET | Freq: Every evening | ORAL | 3 refills | Status: DC | PRN

## 2023-08-25 MED ORDER — SILDENAFIL CITRATE 20 MG PO TABS
100.0000 mg | ORAL_TABLET | Freq: Every evening | ORAL | 3 refills | Status: AC | PRN
  Filled 2023-08-25: qty 30, 6d supply, fill #0
  Filled 2024-02-20: qty 30, 6d supply, fill #1

## 2023-08-25 NOTE — Patient Instructions (Signed)
 Sildenafil 20 mg: Take 4 to 5 tablets at once, 30 to 40 minutes ahead of time. Watch for side effects.  Will refer you to Dr. Leonce for your right shoulder pain  GO TO THE LAB :  Get the blood work   Your results will be posted on MyChart with my comments  Next office visit for a physical exam by January 2026 Please make an appointment before you leave today

## 2023-08-25 NOTE — Progress Notes (Signed)
   Subjective:    Patient ID: Carlin FORBES [name], male    DOB: 1970-12-17, 53 y.o.   MRN: [id number]  DOS:  08/25/2023 Type of visit - description: Routine visit  Good compliance with Crestor .  Due for FLP. Denies any LUTS Continue with right shoulder pain, mostly when he is pulling or lifting with his right arm.  Denies neck pain.  No paresthesias.  Questionable motor deficit when he has pain  Review of Systems See above   Past Medical History:  Diagnosis Date   Allergy    Irregular heartbeat    Migraine    Ruptured lumbar disc    No surgery   Sleep apnea    on cpap   Syncope    w/u done in  [facility]    Past Surgical History:  Procedure Laterality Date   VASECTOMY  05/2013    Current Outpatient Medications  Medication Instructions   amLODipine  (NORVASC ) 10 mg, Oral, Daily   aspirin EC 81 mg, Daily   finasteride (PROPECIA) 1 mg, Daily   meloxicam  (MOBIC ) 15 mg, Oral, Daily PRN   ondansetron  (ZOFRAN  ODT) 4 mg, Oral, Every 8 hours PRN   rosuvastatin  (CRESTOR ) 5 mg, Oral, Daily   SUMAtriptan  (IMITREX ) 50 MG tablet TAKE 1 TABLET BY MOUTH WITH ONSET OF HEADACHE, MAY REPEAT IN 2 HOURS IF HEADACHE PERSIST       Objective:   Physical Exam BP 132/76   Pulse 72   Temp 97.8 F (36.6 C) (Oral)   Resp 16   Ht 6' 4 (1.93 m)   Wt 225 lb 2 oz (102.1 kg)   SpO2 97%   BMI 27.40 kg/m  General:   Well developed, NAD, BMI noted. HEENT:  Normocephalic . Face symmetric, atraumatic Shoulders: L normal R: Symmetric, ROM normal, + pain triggered by arm elevation. Lower extremities: no pretibial edema bilaterally  Skin: Not pale. Not jaundice Neurologic:  alert & oriented X3.  Speech normal, gait appropriate for age and unassisted Psych--  Cognition and judgment appear intact.  Cooperative with normal attention span and concentration.  Behavior appropriate. No anxious or depressed appearing.      Assessment     Assessment HTN dx 09/2019  OSA dx 09-2020, +CPAP  (@[facility] ) H/o  migraines H/o syncope, saw cards  ++ PVCs per holter 2015 + FH CAD: saw cards, 03/04/2022: Coronary calcium  score 0 , LP (a) elevated , rx statins   PLAN Right shoulder pain: Ongoing, not severe, no evidence of radiculopathy.  Referd to sports medicine for further evaluation.  Impingement?. Dyslipidemia: Mild, on statins mostly due to FH CAD, good compliance and tolerance, last LFTs normal.  Check FLP ED: Having mild problems with erections, treatment?  See no contraindication for sildenafil , how to use it and what to expect, side effects discussed with patient.  Rx printed. RTC CPX 02/2024

## 2023-08-26 DIAGNOSIS — N529 Male erectile dysfunction, unspecified: Secondary | ICD-10-CM | POA: Insufficient documentation

## 2023-08-26 NOTE — Assessment & Plan Note (Signed)
 Right shoulder pain: Ongoing, not severe, no evidence of radiculopathy.  Referd to sports medicine for further evaluation.  Impingement?. Dyslipidemia: Mild, on statins mostly due to FH CAD, good compliance and tolerance, last LFTs normal.  Check FLP ED: Having mild problems with erections, treatment?  See no contraindication for sildenafil , how to use it and what to expect, side effects discussed with patient.  Rx printed. RTC CPX 02/2024

## 2023-08-28 ENCOUNTER — Ambulatory Visit: Payer: Self-pay | Admitting: Internal Medicine

## 2023-09-01 ENCOUNTER — Other Ambulatory Visit: Payer: Self-pay | Admitting: Cardiology

## 2023-09-07 ENCOUNTER — Other Ambulatory Visit: Payer: Self-pay | Admitting: Cardiology

## 2023-09-07 NOTE — Progress Notes (Unsigned)
    Ben Jackson D.[facility] [address] [place] [address] Phone: [phone]   Assessment and Plan:     There are no diagnoses linked to this encounter.  ***   Pertinent previous records reviewed include ***    Follow Up: ***     Subjective:   I, Jamario Colina, am serving as a Neurosurgeon for Doctor Morene Mace   Chief Complaint: right shoulder pain    HPI:  09/08/2023 Patient is a 53 year old male with right shoulder pain. Patient states   Additional pertinent review of systems negative.   Current Outpatient Medications:    amLODipine  (NORVASC ) 10 MG tablet, Take 1 tablet (10 mg total) by mouth daily., Disp: 90 tablet, Rfl: 1   aspirin EC 81 MG tablet, Take 81 mg by mouth daily. Swallow whole., Disp: , Rfl:    finasteride (PROPECIA) 1 MG tablet, Take 1 mg by mouth daily., Disp: , Rfl:    meloxicam  (MOBIC ) 15 MG tablet, Take 1 tablet (15 mg total) by mouth daily as needed for pain., Disp: 30 tablet, Rfl: 0   ondansetron  (ZOFRAN  ODT) 4 MG disintegrating tablet, Take 1 tablet (4 mg total) by mouth every 8 (eight) hours as needed for nausea or vomiting. (Patient not taking: Reported on 08/25/2023), Disp: 30 tablet, Rfl: 1   rosuvastatin  (CRESTOR ) 5 MG tablet, TAKE 1 TABLET BY MOUTH DAILY, Disp: 30 tablet, Rfl: 0   sildenafil  (REVATIO ) 20 MG tablet, Take 4-5 tablets (80-100 mg total) by mouth at bedtime as needed., Disp: 30 tablet, Rfl: 3   sildenafil  (REVATIO ) 20 MG tablet, Take 4-5 tablets (80-100 mg total) by mouth at bedtime as needed., Disp: 30 tablet, Rfl: 3   SUMAtriptan  (IMITREX ) 50 MG tablet, TAKE 1 TABLET BY MOUTH WITH ONSET OF HEADACHE, MAY REPEAT IN 2 HOURS IF HEADACHE PERSIST, Disp: 20 tablet, Rfl: 2   Objective:     There were no vitals filed for this visit.    There is no height or weight on file to calculate BMI.    Physical Exam:    ***   Electronically signed by:  Odis Mace [facility] 7:48 AM 09/07/23

## 2023-09-08 ENCOUNTER — Ambulatory Visit: Admitting: Sports Medicine

## 2023-09-08 VITALS — BP 126/78 | HR 87 | Ht 76.0 in | Wt 229.0 lb

## 2023-09-08 DIAGNOSIS — M25511 Pain in right shoulder: Secondary | ICD-10-CM | POA: Diagnosis not present

## 2023-09-08 DIAGNOSIS — M7551 Bursitis of right shoulder: Secondary | ICD-10-CM | POA: Diagnosis not present

## 2023-09-08 DIAGNOSIS — G8929 Other chronic pain: Secondary | ICD-10-CM

## 2023-09-08 NOTE — Patient Instructions (Signed)
 Shoulder HEP 4 week follow up

## 2023-10-04 ENCOUNTER — Other Ambulatory Visit: Payer: Self-pay | Admitting: Cardiology

## 2023-10-12 NOTE — Progress Notes (Unsigned)
    [facility] [address] [place] [address] Phone: [phone]   Assessment and Plan:     1. Chronic right shoulder pain (Primary) 2. Subacromial bursitis of right shoulder joint -Chronic with exacerbation, subsequent visit - consistent with improving subacromial bursitis versus impingement syndrome of right shoulder.  Moderate improvement after subacromial CSI at office on 09/08/2023, with only mild, infrequent, brief episodes of discomfort in the last week - Use meloxicam  15 mg daily as needed for pain.  Recommend limiting chronic NSAIDs to 1-2 doses per week to prevent long-term side effects. -Continue HEP and may gradually increase physical activity as tolerated   Pertinent previous records reviewed include none   Follow Up: As needed if no improvement or worsening of symptoms.  Could consider x-ray versus ultrasound of shoulder.  Could consider physical therapy versus repeat NSAID course versus repeat CSI   Subjective:   I, [name], am serving as a Neurosurgeon for Doctor Morene Mace   Chief Complaint: right shoulder pain    HPI:  09/08/2023 Patient is a 53 year old male with right shoulder pain. Patient states pain for 6-9 months . Decreased ROM. Pain with flexion and extension. 9 months ago slipped off a ladder and landed on right side maybe 3 ft drop. Sore and stiffness if he sleeps on it. Intermittent ibu use doesn't know if it helps. Pain radiates to the back of the elbow. Decreased grip strength.   10/13/2023 Patient states he's been doing a little bit better    Additional pertinent review of systems negative.  Additional pertinent review of systems negative.   Current Outpatient Medications:    amLODipine  (NORVASC ) 10 MG tablet, Take 1 tablet (10 mg total) by mouth daily., Disp: 90 tablet, Rfl: 1   aspirin EC 81 MG tablet, Take 81 mg by mouth daily. Swallow whole., Disp: , Rfl:    finasteride (PROPECIA) 1  MG tablet, Take 1 mg by mouth daily., Disp: , Rfl:    meloxicam  (MOBIC ) 15 MG tablet, Take 1 tablet (15 mg total) by mouth daily as needed for pain., Disp: 30 tablet, Rfl: 0   rosuvastatin  (CRESTOR ) 5 MG tablet, TAKE 1 TABLET BY MOUTH DAILY, Disp: 30 tablet, Rfl: 0   sildenafil  (REVATIO ) 20 MG tablet, Take 4-5 tablets (80-100 mg total) by mouth at bedtime as needed., Disp: 30 tablet, Rfl: 3   SUMAtriptan  (IMITREX ) 50 MG tablet, TAKE 1 TABLET BY MOUTH WITH ONSET OF HEADACHE, MAY REPEAT IN 2 HOURS IF HEADACHE PERSIST, Disp: 20 tablet, Rfl: 2   Objective:     Vitals:   10/13/23 0801  Weight: 224 lb (101.6 kg)  Height: 6' 4 (1.93 m)      Body mass index is 27.27 kg/m.    Physical Exam:    Gen: Appears well, nad, nontoxic and pleasant Neuro:sensation intact, strength is 5/5 with df/pf/inv/ev, muscle tone wnl Skin: no suspicious lesion or defmority Psych: A&O, appropriate mood and affect  Right shoulder:  No deformity, swelling or muscle wasting No scapular winging FF 180, abd 180, int 0, ext 90 NTTP over the [place], clavicle, ac, coracoid, biceps groove, humerus, deltoid, trapezius, cervical spine Neg neer, hawkins, empty can, obriens, crossarm, subscap liftoff, speeds Neg ant drawer, sulcus sign, apprehension Negative Spurling's test bilat FROM of neck    Electronically signed by:  Odis Mace [name] 8:04 AM 10/13/23

## 2023-10-13 ENCOUNTER — Ambulatory Visit (INDEPENDENT_AMBULATORY_CARE_PROVIDER_SITE_OTHER): Admitting: Sports Medicine

## 2023-10-13 VITALS — BP 132/82 | HR 84 | Ht 76.0 in | Wt 224.0 lb

## 2023-10-13 DIAGNOSIS — M25511 Pain in right shoulder: Secondary | ICD-10-CM

## 2023-10-13 DIAGNOSIS — G8929 Other chronic pain: Secondary | ICD-10-CM | POA: Diagnosis not present

## 2023-10-13 DIAGNOSIS — M7551 Bursitis of right shoulder: Secondary | ICD-10-CM

## 2023-10-13 NOTE — Patient Instructions (Signed)
 Continue HEP   Gradually increase activity as tolerated  - Use meloxicam  15 mg daily as needed for pain.  Recommend limiting chronic NSAIDs to 1-2 doses per week to prevent long-term side effects.  As needed follow up

## 2023-11-04 ENCOUNTER — Encounter: Payer: Self-pay | Admitting: Internal Medicine

## 2023-11-06 MED ORDER — ROSUVASTATIN CALCIUM 5 MG PO TABS: 5.0000 mg | ORAL_TABLET | Freq: Every day | ORAL | 1 refills | Status: AC

## 2023-11-06 NOTE — Telephone Encounter (Signed)
**Note De-identified  [place] Obfuscation** Please advise 

## 2023-11-06 NOTE — Telephone Encounter (Signed)
 Per PCP- okay to send.

## 2023-11-29 ENCOUNTER — Encounter: Payer: Self-pay | Admitting: Internal Medicine

## 2023-12-13 ENCOUNTER — Other Ambulatory Visit: Payer: Self-pay | Admitting: Internal Medicine

## 2024-02-20 ENCOUNTER — Other Ambulatory Visit (HOSPITAL_BASED_OUTPATIENT_CLINIC_OR_DEPARTMENT_OTHER): Payer: Self-pay

## 2024-02-23 ENCOUNTER — Ambulatory Visit (INDEPENDENT_AMBULATORY_CARE_PROVIDER_SITE_OTHER): Payer: 59 | Admitting: Internal Medicine

## 2024-02-23 ENCOUNTER — Encounter: Payer: Self-pay | Admitting: Internal Medicine

## 2024-02-23 VITALS — BP 126/82 | HR 61 | Temp 97.9°F | Resp 16 | Ht 76.0 in | Wt 234.0 lb

## 2024-02-23 DIAGNOSIS — I10 Essential (primary) hypertension: Secondary | ICD-10-CM | POA: Diagnosis not present

## 2024-02-23 DIAGNOSIS — Z8042 Family history of malignant neoplasm of prostate: Secondary | ICD-10-CM | POA: Diagnosis not present

## 2024-02-23 DIAGNOSIS — E785 Hyperlipidemia, unspecified: Secondary | ICD-10-CM

## 2024-02-23 DIAGNOSIS — Z0000 Encounter for general adult medical examination without abnormal findings: Secondary | ICD-10-CM

## 2024-02-23 LAB — URINALYSIS, ROUTINE W REFLEX MICROSCOPIC
Bilirubin Urine: NEGATIVE
Hgb urine dipstick: NEGATIVE
Ketones, ur: NEGATIVE
Leukocytes,Ua: NEGATIVE
Nitrite: NEGATIVE
Specific Gravity, Urine: 1.01 (ref 1.000–1.030)
Total Protein, Urine: NEGATIVE
Urine Glucose: NEGATIVE
Urobilinogen, UA: 0.2 (ref 0.0–1.0)
pH: 7.5 (ref 5.0–8.0)

## 2024-02-23 LAB — PSA: PSA: 0.78 ng/mL (ref 0.10–4.00)

## 2024-02-23 LAB — COMPREHENSIVE METABOLIC PANEL WITH GFR
ALT: 26 U/L (ref 3–53)
AST: 26 U/L (ref 5–37)
Albumin: 4.5 g/dL (ref 3.5–5.2)
Alkaline Phosphatase: 54 U/L (ref 39–117)
BUN: 14 mg/dL (ref 6–23)
CO2: 31 meq/L (ref 19–32)
Calcium: 10 mg/dL (ref 8.4–10.5)
Chloride: 103 meq/L (ref 96–112)
Creatinine, Ser: 1.13 mg/dL (ref 0.40–1.50)
GFR: 72.97 mL/min
Glucose, Bld: 89 mg/dL (ref 70–99)
Potassium: 4.4 meq/L (ref 3.5–5.1)
Sodium: 140 meq/L (ref 135–145)
Total Bilirubin: 0.6 mg/dL (ref 0.2–1.2)
Total Protein: 7.2 g/dL (ref 6.0–8.3)

## 2024-02-23 LAB — LIPID PANEL
Cholesterol: 150 mg/dL (ref 28–200)
HDL: 50.9 mg/dL
LDL Cholesterol: 80 mg/dL (ref 10–99)
NonHDL: 98.68
Total CHOL/HDL Ratio: 3
Triglycerides: 93 mg/dL (ref 10.0–149.0)
VLDL: 18.6 mg/dL (ref 0.0–40.0)

## 2024-02-23 LAB — CBC WITH DIFFERENTIAL/PLATELET
Basophils Absolute: 0.1 K/uL (ref 0.0–0.1)
Basophils Relative: 1.7 % (ref 0.0–3.0)
Eosinophils Absolute: 0.1 K/uL (ref 0.0–0.7)
Eosinophils Relative: 3.2 % (ref 0.0–5.0)
HCT: 42.7 % (ref 39.0–52.0)
Hemoglobin: 15.1 g/dL (ref 13.0–17.0)
Lymphocytes Relative: 27.7 % (ref 12.0–46.0)
Lymphs Abs: 0.9 K/uL (ref 0.7–4.0)
MCHC: 35.3 g/dL (ref 30.0–36.0)
MCV: 86.6 fl (ref 78.0–100.0)
Monocytes Absolute: 0.4 K/uL (ref 0.1–1.0)
Monocytes Relative: 12.8 % — ABNORMAL HIGH (ref 3.0–12.0)
Neutro Abs: 1.7 K/uL (ref 1.4–7.7)
Neutrophils Relative %: 54.6 % (ref 43.0–77.0)
Platelets: 286 K/uL (ref 150.0–400.0)
RBC: 4.93 Mil/uL (ref 4.22–5.81)
RDW: 12.7 % (ref 11.5–15.5)
WBC: 3.1 K/uL — ABNORMAL LOW (ref 4.0–10.5)

## 2024-02-23 NOTE — Assessment & Plan Note (Signed)
 Here for CPX -Tdap 2018 - s/p Shingrix x2 - had a flu shot. - Consider COVID booster - CCS: cscope 02/2019, next 10 years per GI letter  -+FH Prostate ca: Has occasional nocturia, DRE negative, checking a PSA and a UA -Labs: CMP FLP CBC PSA UA -Diet exercise: Encouraged a healthy lifestyle .

## 2024-02-23 NOTE — Patient Instructions (Addendum)
 Please read your instructions carefully.   GO TO THE LAB :  Get the blood work    Go to the front desk for the checkout Please make an appointment for a checkup in 6 months     Continue checking your blood pressure regularly Blood pressure goal:  between 110/65 and  130/80. If it is consistently higher or lower, let me know   PRIMARY HYPERTENSION: Your blood pressure is well-controlled with your current medication. -Continue taking amlodipine  10 mg orally every day.  HYPERLIPIDEMIA: Your cholesterol is managed well with your current medication. -Continue taking rosuvastatin  5 mg orally every day.  ERECTILE DYSFUNCTION: Your current medication is effective. -Continue using sildenafil  as needed.  ANDROGENIC ALOPECIA: Your condition remains unchanged with your current medication. -Continue taking finasteride 1 mg orally every day.  MIGRAINE: You have not had any recent migraines and have not used sumatriptan  for over a year. - If you have migraine please let me know, we can refill sumatriptan . - If you have severe headache different from your migraine: Seek medical attention

## 2024-02-23 NOTE — Progress Notes (Signed)
 "  Subjective:    Patient ID: Joe Castro., male    DOB: 1970/03/28, 54 y.o.   MRN: [id number]  DOS:  02/23/2024 CPX  Discussed the use of AI scribe software for clinical note transcription with the patient, who gave verbal consent to proceed.  History of Present Illness Joe Castro is a 54 year old male who presents for an annual physical exam.  Right shoulder pain - Managing right shoulder bursitis with exercises.  Sleep apnea - Uses CPAP machine nightly. - Wakes once nightly to urinate. - No dysuria or hematuria.  Migraine headaches - Has not used sumatriptan  for migraines in over a year. - Requested removal of sumatriptan  from medication list.  Cardiopulmonary and gastrointestinal symptoms - No chest pain, dyspnea, nausea, vomiting, or diarrhea.   Review of Systems  Other than above, a 14 point review of systems is negative   Past Medical History:  Diagnosis Date   Allergy    Irregular heartbeat    Migraine    Ruptured lumbar disc    No surgery   Sleep apnea    on cpap   Syncope    w/u done in  [facility]    Past Surgical History:  Procedure Laterality Date   VASECTOMY  05/2013    Current Outpatient Medications  Medication Instructions   amLODipine  (NORVASC ) 10 mg, Oral, Daily   aspirin EC 81 mg, Daily   finasteride (PROPECIA) 1 mg, Daily   meloxicam  (MOBIC ) 15 mg, Oral, Daily PRN   rosuvastatin  (CRESTOR ) 5 mg, Oral, Daily   sildenafil  (REVATIO ) 20 MG tablet Take 4-5 tablets (80-100 mg total) by mouth at bedtime as needed.       Objective:   Physical Exam BP 126/82   Pulse 61   Temp 97.9 F (36.6 C) (Oral)   Resp 16   Ht 6' 4 (1.93 m)   Wt 234 lb (106.1 kg)   SpO2 98%   BMI 28.48 kg/m  General: Well developed, NAD, BMI noted Neck: No  thyromegaly  HEENT:  Normocephalic . Face symmetric, atraumatic Lungs:  CTA B Normal respiratory effort, no intercostal retractions, no accessory muscle use. Heart: RRR,  no murmur.   Abdomen:  Not distended, soft, non-tender. No rebound or rigidity.   Lower extremities: no pretibial edema bilaterally DRE: Normal sphincter tone, normal prostate, normal stools. Skin: Exposed areas without rash. Not pale. Not jaundice Neurologic:  alert & oriented X3.  Speech normal, gait appropriate for age and unassisted Strength symmetric and appropriate for age.  Psych: Cognition and judgment appear intact.  Cooperative with normal attention span and concentration.  Behavior appropriate. No anxious or depressed appearing.     Assessment    Assessment HTN dx 09/2019  OSA dx 09-2020, +CPAP  (@[facility] ) H/o migraines H/o syncope, saw cards  ++ PVCs per holter 2015 + FH CAD: saw cards, 03/04/2022: Coronary calcium  score 0 , LP (a) elevated , rx statins    Assessment & Plan Here for CPX -Tdap 2018 - s/p Shingrix x2 - had a flu shot. - Consider COVID booster - CCS: cscope 02/2019, next 10 years per GI letter  -+FH Prostate ca: Has occasional nocturia, DRE negative, checking a PSA and a UA -Labs: CMP FLP CBC PSA UA -Diet exercise: Encouraged a healthy lifestyle .   Other issues HTN Blood pressure well-controlled with current medication.  On amlodipine  10 mg daily Hyperlipidemia Cholesterol managed with rosuvastatin , effective in preventing cardiovascular events. - Continue rosuvastatin   5 mg oral daily. E D Sildenafil  effective.  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 Migraine No recent migraines, sumatriptan  unused for over a year. - Removed sumatriptan  from medication list but okay to refill as needed OSA: Good compliance with nightly CPAP use. RTC 6 months     "

## 2024-02-23 NOTE — Assessment & Plan Note (Signed)
 Here for CPX  Other issues HTN Blood pressure well-controlled with current medication.  On amlodipine  10 mg daily Hyperlipidemia Cholesterol managed with rosuvastatin , effective in preventing cardiovascular events. - Continue rosuvastatin  5 mg oral daily. E D Sildenafil  effective.  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 Migraine No recent migraines, sumatriptan  unused for over a year. - Removed sumatriptan  from medication list but okay to refill as needed OSA: Good compliance with nightly CPAP use. RTC 6 months

## 2024-02-26 ENCOUNTER — Ambulatory Visit: Payer: Self-pay | Admitting: Internal Medicine

## 2024-08-30 ENCOUNTER — Ambulatory Visit: Admitting: Internal Medicine
